# Patient Record
Sex: Female | Born: 1990 | Race: White | Hispanic: No | Smoking: Never smoker
Health system: Southern US, Community
[De-identification: ages and names within clinical notes are randomized; demographics above are authoritative.]

## PROBLEM LIST (undated history)

## (undated) DIAGNOSIS — R87629 Unspecified abnormal cytological findings in specimens from vagina: Secondary | ICD-10-CM

## (undated) DIAGNOSIS — I10 Essential (primary) hypertension: Secondary | ICD-10-CM

## (undated) DIAGNOSIS — O139 Gestational [pregnancy-induced] hypertension without significant proteinuria, unspecified trimester: Secondary | ICD-10-CM

## (undated) HISTORY — DX: Gestational (pregnancy-induced) hypertension without significant proteinuria, unspecified trimester: O13.9

## (undated) HISTORY — DX: Essential (primary) hypertension: I10

## (undated) HISTORY — DX: Unspecified abnormal cytological findings in specimens from vagina: R87.629

## (undated) HISTORY — PX: NO PAST SURGERIES: SHX2092

---
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
## 2010-09-13 LAB — DIFFERENTIAL
Basophils Absolute: 0.1 10*3/uL (ref 0.0–0.1)
Basophils Relative: 1 % (ref 0–1)
Eosinophils Absolute: 0.2 10*3/uL (ref 0.0–0.7)
Eosinophils Relative: 2 % (ref 0–5)
Lymphocytes Relative: 22 % (ref 12–46)
Lymphs Abs: 2.5 10*3/uL (ref 0.7–4.0)
Monocytes Absolute: 0.6 10*3/uL (ref 0.1–1.0)
Monocytes Relative: 6 % (ref 3–12)
Neutro Abs: 8.1 10*3/uL — ABNORMAL HIGH (ref 1.7–7.7)
Neutrophils Relative %: 70 % (ref 43–77)

## 2010-09-13 LAB — URINALYSIS, ROUTINE W REFLEX MICROSCOPIC
Bilirubin Urine: NEGATIVE
Glucose, UA: NEGATIVE mg/dL
Hgb urine dipstick: NEGATIVE
Ketones, ur: NEGATIVE mg/dL
Nitrite: NEGATIVE
Protein, ur: NEGATIVE mg/dL
Specific Gravity, Urine: 1.015 (ref 1.005–1.030)
Urobilinogen, UA: 0.2 mg/dL (ref 0.0–1.0)
pH: 7.5 (ref 5.0–8.0)

## 2010-09-13 LAB — CBC
HCT: 36.7 % (ref 36.0–46.0)
Hemoglobin: 12.6 g/dL (ref 12.0–15.0)
MCHC: 34.5 g/dL (ref 30.0–36.0)
MCV: 85.4 fL (ref 78.0–100.0)
Platelets: 286 10*3/uL (ref 150–400)
RBC: 4.29 MIL/uL (ref 3.87–5.11)
RDW: 14.2 % (ref 11.5–15.5)
WBC: 11.5 10*3/uL — ABNORMAL HIGH (ref 4.0–10.5)

## 2010-09-13 LAB — WET PREP, GENITAL
Trich, Wet Prep: NONE SEEN
Yeast Wet Prep HPF POC: NONE SEEN

## 2010-09-13 LAB — GC/CHLAMYDIA PROBE AMP, GENITAL
Chlamydia, DNA Probe: NEGATIVE
GC Probe Amp, Genital: NEGATIVE

## 2010-09-13 LAB — PREGNANCY, URINE: Preg Test, Ur: NEGATIVE

## 2010-09-17 ENCOUNTER — Emergency Department (HOSPITAL_COMMUNITY): Payer: No Typology Code available for payment source

## 2010-09-17 ENCOUNTER — Emergency Department (HOSPITAL_COMMUNITY)
Admission: EM | Admit: 2010-09-17 | Discharge: 2010-09-17 | Disposition: A | Discharge status: Home / Self Care | Payer: No Typology Code available for payment source | Attending: Emergency Medicine | Admitting: Emergency Medicine

## 2010-09-17 DIAGNOSIS — M25539 Pain in unspecified wrist: Secondary | ICD-10-CM | POA: Insufficient documentation

## 2010-09-17 DIAGNOSIS — S63509A Unspecified sprain of unspecified wrist, initial encounter: Secondary | ICD-10-CM | POA: Insufficient documentation

## 2010-09-18 LAB — DIFFERENTIAL
Basophils Absolute: 0 10*3/uL (ref 0.0–0.1)
Basophils Relative: 0 % (ref 0–1)
Eosinophils Absolute: 0 10*3/uL (ref 0.0–0.7)
Eosinophils Relative: 0 % (ref 0–5)
Lymphocytes Relative: 4 % — ABNORMAL LOW (ref 12–46)
Lymphs Abs: 0.5 10*3/uL — ABNORMAL LOW (ref 0.7–4.0)
Monocytes Absolute: 0.3 10*3/uL (ref 0.1–1.0)
Monocytes Relative: 2 % — ABNORMAL LOW (ref 3–12)
Neutro Abs: 13.8 10*3/uL — ABNORMAL HIGH (ref 1.7–7.7)
Neutrophils Relative %: 94 % — ABNORMAL HIGH (ref 43–77)

## 2010-09-18 LAB — URINALYSIS, ROUTINE W REFLEX MICROSCOPIC
Bilirubin Urine: NEGATIVE
Glucose, UA: NEGATIVE mg/dL
Hgb urine dipstick: NEGATIVE
Nitrite: NEGATIVE
Protein, ur: NEGATIVE mg/dL
Specific Gravity, Urine: 1.025 (ref 1.005–1.030)
Urobilinogen, UA: 0.2 mg/dL (ref 0.0–1.0)
pH: 5.5 (ref 5.0–8.0)

## 2010-09-18 LAB — CBC
HCT: 41.5 % (ref 36.0–46.0)
Hemoglobin: 14.1 g/dL (ref 12.0–15.0)
MCHC: 33.9 g/dL (ref 30.0–36.0)
MCV: 83.8 fL (ref 78.0–100.0)
Platelets: 266 10*3/uL (ref 150–400)
RBC: 4.96 MIL/uL (ref 3.87–5.11)
RDW: 13.4 % (ref 11.5–15.5)
WBC: 14.7 10*3/uL — ABNORMAL HIGH (ref 4.0–10.5)

## 2010-09-18 LAB — COMPREHENSIVE METABOLIC PANEL
ALT: 13 U/L (ref 0–35)
AST: 20 U/L (ref 0–37)
Albumin: 4.1 g/dL (ref 3.5–5.2)
Alkaline Phosphatase: 69 U/L (ref 39–117)
BUN: 11 mg/dL (ref 6–23)
CO2: 27 mEq/L (ref 19–32)
Calcium: 9 mg/dL (ref 8.4–10.5)
Chloride: 103 mEq/L (ref 96–112)
Creatinine, Ser: 0.75 mg/dL (ref 0.4–1.2)
GFR calc Af Amer: >60 mL/min (ref >60)
GFR calc non Af Amer: >60 mL/min (ref >60)
Glucose, Bld: 114 mg/dL — ABNORMAL HIGH (ref 70–99)
Potassium: 4 mEq/L (ref 3.5–5.1)
Sodium: 138 mEq/L (ref 135–145)
Total Bilirubin: 0.4 mg/dL (ref 0.3–1.2)
Total Protein: 7.2 g/dL (ref 6.0–8.3)

## 2010-09-18 LAB — LIPASE, BLOOD: Lipase: 20 U/L (ref 11–59)

## 2010-09-18 LAB — PREGNANCY, URINE: Preg Test, Ur: NEGATIVE

## 2011-03-02 LAB — BASIC METABOLIC PANEL
BUN: 13
CO2: 25
Calcium: 8.9
Chloride: 108
Creatinine, Ser: 0.88
Glucose, Bld: 91
Potassium: 3.7
Sodium: 137

## 2011-03-02 LAB — URINALYSIS, ROUTINE W REFLEX MICROSCOPIC
Bilirubin Urine: NEGATIVE
Glucose, UA: NEGATIVE
Hgb urine dipstick: NEGATIVE
Ketones, ur: NEGATIVE
Nitrite: NEGATIVE
Protein, ur: NEGATIVE
Specific Gravity, Urine: 1.025
Urobilinogen, UA: 1
pH: 6

## 2011-03-02 LAB — DIFFERENTIAL
Basophils Absolute: 0.1
Basophils Relative: 1
Eosinophils Absolute: 0.2
Eosinophils Relative: 2
Lymphocytes Relative: 23 — ABNORMAL LOW
Lymphs Abs: 2.5
Monocytes Absolute: 0.7
Monocytes Relative: 7
Neutro Abs: 7.3
Neutrophils Relative %: 67

## 2011-03-02 LAB — CBC
HCT: 37.7
Hemoglobin: 12.9
MCHC: 34.2
MCV: 85
Platelets: 284
RBC: 4.43
RDW: 13.7
WBC: 10.9

## 2011-03-02 LAB — PREGNANCY, URINE: Preg Test, Ur: NEGATIVE

## 2012-07-15 ENCOUNTER — Other Ambulatory Visit (HOSPITAL_COMMUNITY)
Admission: RE | Admit: 2012-07-15 | Discharge: 2012-07-15 | Disposition: A | Discharge status: Home / Self Care | Payer: Self-pay | Source: Ambulatory Visit | Attending: Unknown Physician Specialty | Admitting: Unknown Physician Specialty

## 2012-07-15 DIAGNOSIS — N870 Mild cervical dysplasia: Secondary | ICD-10-CM | POA: Insufficient documentation

## 2012-07-15 DIAGNOSIS — R87612 Low grade squamous intraepithelial lesion on cytologic smear of cervix (LGSIL): Secondary | ICD-10-CM | POA: Insufficient documentation

## 2013-06-17 ENCOUNTER — Other Ambulatory Visit: Payer: Self-pay | Admitting: Advanced Practice Midwife

## 2013-06-24 ENCOUNTER — Other Ambulatory Visit: Payer: Self-pay | Admitting: Women's Health

## 2013-06-30 ENCOUNTER — Other Ambulatory Visit: Payer: Self-pay | Admitting: Women's Health

## 2013-07-27 ENCOUNTER — Ambulatory Visit (INDEPENDENT_AMBULATORY_CARE_PROVIDER_SITE_OTHER): Payer: BC Managed Care – PPO | Admitting: Women's Health

## 2013-07-27 ENCOUNTER — Encounter: Payer: Self-pay | Admitting: Women's Health

## 2013-07-27 ENCOUNTER — Other Ambulatory Visit (HOSPITAL_COMMUNITY)
Admission: RE | Admit: 2013-07-27 | Discharge: 2013-07-27 | Disposition: A | Discharge status: Home / Self Care | Payer: BC Managed Care – PPO | Source: Ambulatory Visit | Attending: Obstetrics & Gynecology | Admitting: Obstetrics & Gynecology

## 2013-07-27 VITALS — BP 106/80 | Ht 64.0 in | Wt 149.5 lb

## 2013-07-27 DIAGNOSIS — Z113 Encounter for screening for infections with a predominantly sexual mode of transmission: Secondary | ICD-10-CM | POA: Insufficient documentation

## 2013-07-27 DIAGNOSIS — R87619 Unspecified abnormal cytological findings in specimens from cervix uteri: Secondary | ICD-10-CM | POA: Insufficient documentation

## 2013-07-27 DIAGNOSIS — R102 Pelvic and perineal pain unspecified side: Secondary | ICD-10-CM

## 2013-07-27 DIAGNOSIS — Z01419 Encounter for gynecological examination (general) (routine) without abnormal findings: Secondary | ICD-10-CM

## 2013-07-27 LAB — HIV ANTIBODY (ROUTINE TESTING W REFLEX): HIV: NONREACTIVE

## 2013-07-27 LAB — RPR

## 2013-07-27 LAB — HEPATITIS B SURFACE ANTIGEN: Hepatitis B Surface Ag: NEGATIVE

## 2013-07-27 LAB — HEPATITIS C ANTIBODY: HCV Ab: NEGATIVE

## 2013-07-27 NOTE — Progress Notes (Signed)
Patient ID: Alicia Small, female   DOB: May 17, 1991, 23 y.o.   MRN: [id number] Subjective:     Alicia Small is a 23 y.o. G0P0 Caucasian  female here for a routine well-woman exam.  Patient's last menstrual period was 07/14/2013. Regular menses lasting ~1wk, normal flow, +cramps w/ menses. Current complaints: daily pelvic pain/cramping, like menstrual-type cramps x 5859yr.  She also does not have any pleasurable sensations during sex- states she just feels numb/no feeling. Has used vibrator previously and was able to have orgasm. Denies h/o vaginal trauma or sexual abuse.  Smoking Status: non-smoker Labs: Does desire STI screening, was recently told that she was exposed to chlamydia. Does not have a PCP, does not want routine yearly labs.   The following portions of the patient's history were reviewed and updated as appropriate: allergies, current medications, past family history, past medical history, past social history, past surgical history and problem list.   Gynecologic History Patient's last menstrual period was 07/14/2013. Contraception: condoms, states 2 different kinds of COCs made her nauseated- discussed that there are other options- she does not want to try anything else at this time Last Pap: 2014. Results were: LSIL/HPV, [facility] Last mammogram: never. Results were: n/a  Obstetric History OB History  No data available  G0P0  Review of Systems Patient denies any headaches, blurred vision, shortness of breath, chest pain, abdominal pain, problems with bowel movements, urination, or intercourse.      Objective:     Physical Exam  BP 106/80  Ht 5\' 4"  (1.626 m)  Wt 149 lb 8 oz (67.813 kg)  BMI 25.65 kg/m2  LMP 07/14/2013  General:  Well developed, well nourished, no acute distress. She is alert and oriented x3. Skin:  Warm and dry Neck:  Midline trachea, no thyromegaly or nodules Cardiovascular: Regular rate and rhythm, no murmur  heard Lungs:  Effort normal, all lung fields clear to auscultation bilaterally Breasts:  No dominant palpable mass, retraction, or nipple discharge Abdomen:  Soft, non tender, no hepatosplenomegaly or masses Pelvic:  External genitalia is normal in appearance.  The vagina is normal in appearance. The cervix is bulbous, no CMT.  Thin prep pap is done w/ reflex HR HPV cotesting. Uterus is felt to be normal size, shape, and contour.  No adnexal masses noted. +tenderness Lt adnexal region.  Extremities:  No swelling or varicosities noted Psych:  She has a normal mood and affect  Assessment:     Healthy well-woman exam STD screening Pelvic pain Decreased vaginal sensation H/O abnormal pap smear     Plan:  STD screening today: HIV, RPR, Hep B&C, HSV II, gc/ch from pap  F/U 1wk for pelvic u/s and f/u visit w/ me To try lubrication, different positions, etc to see if it helps w/ decreased vaginal sensation Mammogram @ 23yo or sooner if problems Colonoscopy @ 23yo or sooner if problems   Marge DuncansBooker, Kimberly Randall CNM, [facility] 07/27/2013 10:17 AM

## 2013-07-27 NOTE — Patient Instructions (Signed)
Sexually Transmitted Disease A sexually transmitted disease (STD) is a disease or infection that may be passed (transmitted) from person to person, usually during sexual activity. This may happen by way of saliva, semen, blood, vaginal mucus, or urine. Common STDs include:   Gonorrhea.   Chlamydia.   Syphilis.   HIV and AIDS.   Genital herpes.   Hepatitis B and C.   Trichomonas.   Human papillomavirus (HPV).   Pubic lice.   Scabies.  Mites.  Bacterial vaginosis. WHAT ARE CAUSES OF STDs? An STD may be caused by bacteria, a virus, or parasites. STDs are often transmitted during sexual activity if one person is infected. However, they may also be transmitted through nonsexual means. STDs may be transmitted after:   Sexual intercourse with an infected person.   Sharing sex toys with an infected person.   Sharing needles with an infected person or using unclean piercing or tattoo needles.  Having intimate contact with the genitals, mouth, or rectal areas of an infected person.   Exposure to infected fluids during birth. WHAT ARE THE SIGNS AND SYMPTOMS OF STDs? Different STDs have different symptoms. Some people may not have any symptoms. If symptoms are present, they may include:   Painful or bloody urination.   Pain in the pelvis, abdomen, vagina, anus, throat, or eyes.   Skin rash, itching, irritation, growths, sores (lesions), ulcerations, or warts in the genital or anal area.  Abnormal vaginal discharge with or without bad odor.   Penile discharge in men.   Fever.   Pain or bleeding during sexual intercourse.   Swollen glands in the groin area.   Yellow skin and eyes (jaundice). This is seen with hepatitis.   Swollen testicles.  Infertility.  Sores and blisters in the mouth. HOW ARE STDs DIAGNOSED? To make a diagnosis, your health care provider may:   Take a medical history.   Perform a physical exam.   Take a sample of any  discharge for examination.  Swab the throat, cervix, opening to the penis, rectum, or vagina for testing.  Test a sample of your first morning urine.   Perform blood tests.   Perform a Pap smear, if this applies.   Perform a colposcopy.   Perform a laparoscopy.  HOW ARE STDs TREATED? Treatment depends on the STD. Some STDs may be treated but not cured.   Chlamydia, gonorrhea, trichomonas, and syphilis can be cured with antibiotics.   Genital herpes, hepatitis, and HIV can be treated, but not cured, with prescribed medicines. The medicines lessen symptoms.   Genital warts from HPV can be treated with medicine or by freezing, burning (electrocautery), or surgery. Warts may come back.   HPV cannot be cured with medicine or surgery. However, abnormal areas may be removed from the cervix, vagina, or vulva.   If your diagnosis is confirmed, your recent sexual partners need treatment. This is true even if they are symptom-free or have a negative culture or evaluation. They should not have sex until their health care providers say it is OK. HOW CAN I REDUCE MY RISK OF GETTING AN STD?  Use latex condoms, dental dams, and water-soluble lubricants during sexual activity. Do not use petroleum jelly or oils.  Get vaccinated for HPV and hepatitis. If you have not received these vaccines in the past, talk to your health care provider about whether one or both might be right for you.   Avoid risky sex practices that can break the skin.  WHAT SHOULD   I DO IF I THINK I HAVE AN STD?  See your health care provider.   Inform all sexual partners. They should be tested and treated for any STDs.  Do not have sex until your health care provider says it is OK. WHEN SHOULD I GET HELP? Seek immediate medical care if:  You develop severe abdominal pain.  You are a man and notice swelling or pain in the testicles.  You are a woman and notice swelling or pain in your vagina. Document  Released: 08/11/2002 Document Revised: 03/11/2013 Document Reviewed: 12/09/2012 ExitCare Patient Information 2014 [employer].  

## 2013-07-28 LAB — HSV 2 ANTIBODY, IGG: HSV 2 Glycoprotein G Ab, IgG: <0.1 IV

## 2013-08-04 ENCOUNTER — Encounter: Payer: Self-pay | Admitting: Adult Health

## 2013-08-04 ENCOUNTER — Other Ambulatory Visit: Payer: Self-pay | Admitting: Women's Health

## 2013-08-04 ENCOUNTER — Ambulatory Visit (INDEPENDENT_AMBULATORY_CARE_PROVIDER_SITE_OTHER): Payer: BC Managed Care – PPO

## 2013-08-04 ENCOUNTER — Ambulatory Visit (INDEPENDENT_AMBULATORY_CARE_PROVIDER_SITE_OTHER): Payer: BC Managed Care – PPO | Admitting: Women's Health

## 2013-08-04 ENCOUNTER — Encounter: Payer: Self-pay | Admitting: Women's Health

## 2013-08-04 VITALS — BP 108/60 | Ht 64.0 in | Wt 151.0 lb

## 2013-08-04 DIAGNOSIS — R102 Pelvic and perineal pain: Secondary | ICD-10-CM

## 2013-08-04 DIAGNOSIS — N949 Unspecified condition associated with female genital organs and menstrual cycle: Secondary | ICD-10-CM

## 2013-08-04 DIAGNOSIS — R9389 Abnormal findings on diagnostic imaging of other specified body structures: Secondary | ICD-10-CM

## 2013-08-04 NOTE — Progress Notes (Signed)
Patient ID: Alicia KernJessica Small, female   DOB: 29-Jun-1990, 23 y.o.   MRN: [id number]   [facility]  Patient name: Alicia KernJessica Small MRN [id number]  Date of birth: 29-Jun-1990  CC & HPI:  Alicia KernJessica Small is a 23 y.o. Caucasian female presenting today for f/u after u/s for pelvic pain. No complaints today. Reviewed pap and STD screening results, all neg.   Pertinent History Reviewed:  Medical & Surgical Hx:   Past Medical History  Diagnosis Date  . Abnormal vaginal Pap smear    History reviewed. No pertinent past surgical history. Medications: Reviewed & Updated - see associated section Social History: Reviewed -  reports that she has never smoked. She has never used smokeless tobacco.  Objective Findings:  Vitals: BP 108/60  Ht 5\' 4"  (1.626 m)  Wt 151 lb (68.493 kg)  BMI 25.91 kg/m2  LMP 07/14/2013  Physical Examination: General appearance - alert, well appearing, and in no distress  Pelvic u/s today:  Uterus 7.4 x 4.9 x 3.7 cm, anteverted no myometrial masses noted  Endometrium 17 mm, symmetrical,  Right ovary 3.1 x 2.3 x 1.9 cm, with follicles noted  Left ovary 2.8 x 1.8 x 1.7 cm, with follicles noted  + Free fluid noted within posterior cul-de-sac  Technician Comments:  Anteverted uterus, ENDOM=7517mm, bilateral adnexa/ovaries appears wnl, +Free fluid noted within posterior cul-de-sac   Assessment & Plan:  A:   Pelvic pain P:  Discussed u/s results w/ JAG, will repeat pelvic u/s after menses   Pt to call when menses ends to schedule f/u pelvic u/s and visit  Marge DuncansBooker, Juelz Claar Randall CNM, [facility]NP[facility]BC 08/04/2013 12:18 PM

## 2013-08-24 ENCOUNTER — Encounter: Payer: Self-pay | Admitting: Women's Health

## 2013-08-24 ENCOUNTER — Ambulatory Visit (INDEPENDENT_AMBULATORY_CARE_PROVIDER_SITE_OTHER): Payer: BC Managed Care – PPO | Admitting: Women's Health

## 2013-08-24 VITALS — Ht 64.0 in

## 2013-08-24 DIAGNOSIS — N949 Unspecified condition associated with female genital organs and menstrual cycle: Secondary | ICD-10-CM

## 2013-08-24 DIAGNOSIS — R102 Pelvic and perineal pain: Secondary | ICD-10-CM

## 2013-08-24 NOTE — Patient Instructions (Signed)
Pelvic Pain, Female °Female pelvic pain can be caused by many different things and start from a variety of places. Pelvic pain refers to pain that is located in the lower half of the abdomen and between your hips. The pain may occur over a short period of time (acute) or may be reoccurring (chronic). The cause of pelvic pain may be related to disorders affecting the female reproductive organs (gynecologic), but it may also be related to the bladder, kidney stones, an intestinal complication, or muscle or skeletal problems. Getting help right away for pelvic pain is important, especially if there has been severe, sharp, or a sudden onset of unusual pain. It is also important to get help right away because some types of pelvic pain can be life threatening.  °CAUSES  °Below are only some of the causes of pelvic pain. The causes of pelvic pain can be in one of several categories.  °· Gynecologic. °· Pelvic inflammatory disease. °· Sexually transmitted infection. °· Ovarian cyst or a twisted ovarian ligament (ovarian torsion). °· Uterine lining that grows outside the uterus (endometriosis). °· Fibroids, cysts, or tumors. °· Ovulation. °· Pregnancy. °· Pregnancy that occurs outside the uterus (ectopic pregnancy). °· Miscarriage. °· Labor. °· Abruption of the placenta or ruptured uterus. °· Infection. °· Uterine infection (endometritis). °· Bladder infection. °· Diverticulitis. °· Miscarriage related to a uterine infection (septic abortion). °· Bladder. °· Inflammation of the bladder (cystitis). °· Kidney stone(s). °· Gastrointenstinal. °· Constipation. °· Diverticulitis. °· Neurologic. °· Trauma. °· Feeling pelvic pain because of mental or emotional causes (psychosomatic). °· Cancers of the bowel or pelvis. °EVALUATION  °Your caregiver will want to take a careful history of your concerns. This includes recent changes in your health, a careful gynecologic history of your periods (menses), and a sexual history. Obtaining  your family history and medical history is also important. Your caregiver may suggest a pelvic exam. A pelvic exam will help identify the location and severity of the pain. It also helps in the evaluation of which organ system may be involved. In order to identify the cause of the pelvic pain and be properly treated, your caregiver may order tests. These tests may include:  °· A pregnancy test. °· Pelvic ultrasonography. °· An X-ray exam of the abdomen. °· A urinalysis or evaluation of vaginal discharge. °· Blood tests. °HOME CARE INSTRUCTIONS  °· Only take over-the-counter or prescription medicines for pain, discomfort, or fever as directed by your caregiver.   °· Rest as directed by your caregiver.   °· Eat a balanced diet.   °· Drink enough fluids to make your urine clear or pale yellow, or as directed.   °· Avoid sexual intercourse if it causes pain.   °· Apply warm or cold compresses to the lower abdomen depending on which one helps the pain.   °· Avoid stressful situations.   °· Keep a journal of your pelvic pain. Write down when it started, where the pain is located, and if there are things that seem to be associated with the pain, such as food or your menstrual cycle. °· Follow up with your caregiver as directed.   °SEEK MEDICAL CARE IF: °· Your medicine does not help your pain. °· You have abnormal vaginal discharge. °SEEK IMMEDIATE MEDICAL CARE IF:  °· You have heavy bleeding from the vagina.   °· Your pelvic pain increases.   °· You feel lightheaded or faint.   °· You have chills.   °· You have pain with urination or blood in your urine.   °· You have uncontrolled   diarrhea or vomiting.   °· You have a fever or persistent symptoms for more than 3 days. °· You have a fever and your symptoms suddenly get worse.   °· You are being physically or sexually abused.   °MAKE SURE YOU: °· Understand these instructions. °· Will watch your condition. °· Will get help if you are not doing well or get worse. °Document  Released: 04/17/2004 Document Revised: 11/20/2011 Document Reviewed: 09/10/2011 °ExitCare® Patient Information ©2014 [employer]. ° °

## 2013-08-24 NOTE — Progress Notes (Signed)
Patient ID: Alicia KernJessica Small, female   DOB: [birth year], 23 y.o.   MRN: [id number]   [facility]  Patient name: Alicia KernJessica Small MRN [id number]  Date of birth: [birth year]  CC & HPI:  Alicia KernJessica Small is a 23 y.o. Caucasian female scheduled today for visit, but was supposed to be for repeat pelvic u/s after menses.  Patient's last menstrual period was 08/14/2013. Still having daily cramping, not very bad. Condoms for contraception, her STI screen last month was neg.    Pertinent History Reviewed:  Medical & Surgical Hx:   Past Medical History  Diagnosis Date  . Abnormal vaginal Pap smear    History reviewed. No pertinent past surgical history. Medications: Reviewed & Updated - see associated section Social History: Reviewed -  reports that she has never smoked. She has never used smokeless tobacco.  Objective Findings:  Vitals: Ht 5\' 4"  (1.626 m)  LMP 08/14/2013  Physical Examination: General appearance - alert, well appearing, and in no distress  No results found for this or any previous visit (from the past 24 hour(s)).   Assessment & Plan:  A:   Pelvic pain P:   F/U ~3.5wks for repeat pelvic u/s and visit   Marge DuncansBooker, Tansy Lorek Randall CNM, [facility]NP[facility] 08/24/2013 3:13 PM

## 2013-09-16 ENCOUNTER — Other Ambulatory Visit: Payer: Self-pay | Admitting: Women's Health

## 2013-09-16 ENCOUNTER — Encounter: Payer: Self-pay | Admitting: Women's Health

## 2013-09-16 ENCOUNTER — Ambulatory Visit (INDEPENDENT_AMBULATORY_CARE_PROVIDER_SITE_OTHER): Payer: BC Managed Care – PPO

## 2013-09-16 ENCOUNTER — Ambulatory Visit (INDEPENDENT_AMBULATORY_CARE_PROVIDER_SITE_OTHER): Payer: BC Managed Care – PPO | Admitting: Women's Health

## 2013-09-16 VITALS — BP 118/80 | Ht 64.0 in | Wt 144.5 lb

## 2013-09-16 DIAGNOSIS — R102 Pelvic and perineal pain: Secondary | ICD-10-CM

## 2013-09-16 DIAGNOSIS — R350 Frequency of micturition: Secondary | ICD-10-CM

## 2013-09-16 DIAGNOSIS — R9389 Abnormal findings on diagnostic imaging of other specified body structures: Secondary | ICD-10-CM

## 2013-09-16 DIAGNOSIS — N949 Unspecified condition associated with female genital organs and menstrual cycle: Secondary | ICD-10-CM

## 2013-09-16 DIAGNOSIS — R109 Unspecified abdominal pain: Secondary | ICD-10-CM

## 2013-09-16 LAB — CBC
HCT: 39.9 % (ref 36.0–46.0)
Hemoglobin: 14 g/dL (ref 12.0–15.0)
MCH: 30.2 pg (ref 26.0–34.0)
MCHC: 35.1 g/dL (ref 30.0–36.0)
MCV: 86 fL (ref 78.0–100.0)
PLATELETS: 334 10*3/uL (ref 150–400)
RBC: 4.64 MIL/uL (ref 3.87–5.11)
RDW: 13.2 % (ref 11.5–15.5)
WBC: 6.5 10*3/uL (ref 4.0–10.5)

## 2013-09-16 LAB — SEDIMENTATION RATE: SED RATE: 4 mm/h (ref 0–22)

## 2013-09-16 NOTE — Progress Notes (Signed)
Patient ID: Alicia Small, female   DOB: [birth year], 23 y.o.   MRN: [id number]   [facility]  Patient name: Alicia Small MRN [id number]  Date of birth: [birth year]  CC & HPI:  Alicia Small is a 23 y.o. Caucasian female presenting today for f/u after pelvic u/s for lower pelvic cramping x 6676yr. She had an u/s 08/04/13 which revealed a thickened endometrium and +free fluid in posterior cul-de-sac, however she was in the premenstrual phase of her cycle, so she was brought back today after her period ended and u/s was repeated and revealed normal postmenstrual endometrial thickness and only a minimal amount of free fluid in the posterior cul-de-sac, uterus anteverted w/o myometrial masses, bilateral adnexa/ovaries wnl. Pt still reports daily lower pelvic cramping, like menstrual-type cramping. She had neg STD screening and pap 07/27/13. Reports normal bowel habits, soft-formed stool 1-2x/day, no diarrhea. Urinates frequently: ~5x q am, sporadically throughout day, then wakes up 2-3x/night to void, occ urgency. Also gets urge to void occ w/ sexual intercourse.   Pertinent History Reviewed:  Medical & Surgical Hx:   Past Medical History  Diagnosis Date  . Abnormal vaginal Pap smear    History reviewed. No pertinent past surgical history. Medications: Reviewed & Updated - see associated section Social History: Reviewed -  reports that she has never smoked. She has never used smokeless tobacco.  Objective Findings:  Vitals: BP 118/80  Ht 5\' 4"  (1.626 m)  Wt 144 lb 8 oz (65.545 kg)  BMI 24.79 kg/m2  LMP 09/10/2013  Physical Examination: General appearance - alert, well appearing, and in no distress  Today's u/s: Uterus 6.9 x 4.9 x 3.5 cm, no myometrial masses noted  Endometrium 5.4 mm, symmetrical,  Right ovary 2.3 x 1.6 x 1.4 cm,  Left ovary 2.7 x 1.7 x 1.3 cm,  Minimal amount of free fluid noted within posterior cul-de-sac noted  Technician Comments:  Anteverted uterus  noted, Endom-5.674mm, bilateral adnexa/ovaries appears WNL, Minimal amount of free fluid noted within posterior cul-de-sac noted  Lossie Faesasha M McBride  09/16/2013  10:43 AM   Assessment & Plan:  A:   Daily lower pelvic cramping x 9176yr, possible IC P:  CBC, Sed rate, UA, urine cx today   F/U 1wk for visit w/ [facility] to further discuss sx/possibility of IC   American FinancialKimberly Randall Roselina Burgueno CNM, [facility] 09/16/2013 11:19 AM

## 2013-09-17 LAB — URINALYSIS
Bilirubin Urine: NEGATIVE
Glucose, UA: NEGATIVE mg/dL
HGB URINE DIPSTICK: NEGATIVE
Ketones, ur: NEGATIVE mg/dL
LEUKOCYTES UA: NEGATIVE
NITRITE: NEGATIVE
PROTEIN: NEGATIVE mg/dL
Specific Gravity, Urine: 1.018 (ref 1.005–1.030)
UROBILINOGEN UA: 0.2 mg/dL (ref 0.0–1.0)
pH: 6 (ref 5.0–8.0)

## 2013-09-18 LAB — URINE CULTURE: Colony Count: 5000

## 2013-09-23 ENCOUNTER — Encounter: Payer: BC Managed Care – PPO | Admitting: Obstetrics & Gynecology

## 2013-09-24 ENCOUNTER — Encounter: Payer: Self-pay | Admitting: *Deleted

## 2013-09-24 ENCOUNTER — Encounter: Payer: BC Managed Care – PPO | Admitting: Obstetrics & Gynecology

## 2014-04-22 ENCOUNTER — Ambulatory Visit: Payer: BC Managed Care – PPO | Admitting: Obstetrics & Gynecology

## 2014-04-27 ENCOUNTER — Ambulatory Visit: Payer: BC Managed Care – PPO | Admitting: Obstetrics & Gynecology

## 2014-05-11 ENCOUNTER — Ambulatory Visit: Payer: BC Managed Care – PPO | Admitting: Obstetrics & Gynecology

## 2014-05-11 ENCOUNTER — Encounter: Payer: Self-pay | Admitting: *Deleted

## 2014-10-28 ENCOUNTER — Emergency Department (HOSPITAL_COMMUNITY)
Admission: EM | Admit: 2014-10-28 | Discharge: 2014-10-28 | Disposition: A | Discharge status: Home / Self Care | Payer: BLUE CROSS/BLUE SHIELD | Attending: Emergency Medicine | Admitting: Emergency Medicine

## 2014-10-28 ENCOUNTER — Encounter (HOSPITAL_COMMUNITY): Payer: Self-pay

## 2014-10-28 DIAGNOSIS — N938 Other specified abnormal uterine and vaginal bleeding: Secondary | ICD-10-CM | POA: Diagnosis not present

## 2014-10-28 DIAGNOSIS — M549 Dorsalgia, unspecified: Secondary | ICD-10-CM | POA: Diagnosis not present

## 2014-10-28 DIAGNOSIS — Z880 Allergy status to penicillin: Secondary | ICD-10-CM | POA: Insufficient documentation

## 2014-10-28 DIAGNOSIS — R109 Unspecified abdominal pain: Secondary | ICD-10-CM | POA: Insufficient documentation

## 2014-10-28 DIAGNOSIS — R5383 Other fatigue: Secondary | ICD-10-CM | POA: Diagnosis not present

## 2014-10-28 DIAGNOSIS — Z3202 Encounter for pregnancy test, result negative: Secondary | ICD-10-CM | POA: Diagnosis not present

## 2014-10-28 DIAGNOSIS — R110 Nausea: Secondary | ICD-10-CM | POA: Diagnosis not present

## 2014-10-28 DIAGNOSIS — N939 Abnormal uterine and vaginal bleeding, unspecified: Secondary | ICD-10-CM

## 2014-10-28 LAB — URINALYSIS, ROUTINE W REFLEX MICROSCOPIC
Glucose, UA: NEGATIVE mg/dL
Leukocytes, UA: NEGATIVE
NITRITE: NEGATIVE
PH: 6 (ref 5.0–8.0)
PROTEIN: NEGATIVE mg/dL
Urobilinogen, UA: 1 mg/dL (ref 0.0–1.0)

## 2014-10-28 LAB — URINE MICROSCOPIC-ADD ON

## 2014-10-28 LAB — PREGNANCY, URINE: Preg Test, Ur: NEGATIVE

## 2014-10-28 MED ORDER — ONDANSETRON 4 MG PO TBDP
4.0000 mg | ORAL_TABLET | Freq: Once | ORAL | Status: AC
  Administered 2014-10-28: 4 mg via ORAL

## 2014-10-28 MED ORDER — ONDANSETRON HCL 8 MG PO TABS: 8.0000 mg | ORAL_TABLET | ORAL | Status: DC | PRN

## 2014-10-28 MED ORDER — ONDANSETRON 4 MG PO TBDP
ORAL_TABLET | ORAL | Status: AC
  Filled 2014-10-28: qty 1

## 2014-10-28 NOTE — ED Provider Notes (Signed)
CSN: [id number]     Arrival date & time 10/28/14  1726 History   First MD Initiated Contact with Patient 10/28/14 2013     Chief Complaint  Patient presents with  . Nausea  . Vaginal Bleeding    Patient is a 24 y.o. female presenting with vaginal bleeding. The history is provided by the patient.  Vaginal Bleeding Quality: brown and red. Severity:  Mild Onset quality:  Gradual Duration:  1 day Timing:  Intermittent Chronicity:  New Relieved by:  None tried Worsened by:  Nothing tried Associated symptoms: abdominal pain, back pain, fatigue and nausea   Associated symptoms: no dysuria and no fever    Pt reports she has had nausea for several days She reports she vomited several days ago (none today) She reports she has had some vag bleeding today She reports this is irregular bleeding for her No fever No dysuria She reports mild lower abdominal pain for several days Past Medical History  Diagnosis Date  . Abnormal vaginal Pap smear    History reviewed. No pertinent past surgical history. Family History  Problem Relation Age of Onset  . Hypertension Mother   . Diabetes Maternal Grandfather   . Cancer Maternal Grandfather     lung  . Stroke Maternal Grandfather   . Other Paternal Grandfather     polyps   History  Substance Use Topics  . Smoking status: Never Smoker   . Smokeless tobacco: Never Used  . Alcohol Use: Yes     Comment: rarely   OB History    Gravida Para Term Preterm AB TAB SAB Ectopic Multiple Living   0 0             Review of Systems  Constitutional: Positive for fatigue. Negative for fever.  Gastrointestinal: Positive for nausea and abdominal pain.  Genitourinary: Positive for vaginal bleeding. Negative for dysuria.  Musculoskeletal: Positive for back pain.  All other systems reviewed and are negative.     Allergies  Penicillins  Home Medications   Prior to Admission medications   Medication Sig Start Date End Date Taking? Authorizing  Provider  ondansetron (ZOFRAN) 8 MG tablet Take 1 tablet (8 mg total) by mouth every 4 (four) hours as needed for nausea. 10/28/14   Zadie Rhineonald Kessa Fairbairn, MD   BP 122/86 mmHg  Pulse 100  Temp(Src) 97.9 F (36.6 C) (Oral)  Resp 20  Ht 5\' 4"  (1.626 m)  Wt 140 lb (63.504 kg)  BMI 24.02 kg/m2  SpO2 100%  LMP 10/11/2014 Physical Exam CONSTITUTIONAL: Well developed/well nourished HEAD: Normocephalic/atraumatic EYES: EOMI/PERRL ENMT: Mucous membranes moist NECK: supple no meningeal signs SPINE/BACK:entire spine nontender CV: S1/S2 noted, no murmurs/rubs/gallops noted LUNGS: Lungs are clear to auscultation bilaterally, no apparent distress ABDOMEN: soft, nontender, no rebound or guarding, bowel sounds noted throughout abdomen GU:no cva tenderness NEURO: Pt is awake/alert/appropriate, moves all extremitiesx4.  No facial droop.   EXTREMITIES: pulses normal/equal, full ROM SKIN: warm, color normal PSYCH: no abnormalities of mood noted, alert and oriented to situation  ED Course  Procedures  Pt well appearing, no distress, taking PO No focal abd tenderness I offered pelvic exam for further evaluation but she declined She reports she has GYN followup already arranged for next week  Labs Review Labs Reviewed  URINALYSIS, ROUTINE W REFLEX MICROSCOPIC (NOT AT [facility]) - Abnormal; Notable for the following:    Specific Gravity, Urine >1.030 (*)    Hgb urine dipstick MODERATE (*)    Bilirubin Urine SMALL (*)  Ketones, ur TRACE (*)    All other components within normal limits  URINE MICROSCOPIC-ADD ON - Abnormal; Notable for the following:    Squamous Epithelial / LPF MANY (*)    Bacteria, UA FEW (*)    All other components within normal limits  PREGNANCY, URINE   Medications  ondansetron (ZOFRAN-ODT) disintegrating tablet 4 mg (4 mg Oral Given 10/28/14 2041)     MDM   Final diagnoses:  Nausea  Vaginal bleeding    Nursing notes including past medical history and social history  reviewed and considered in documentation Labs/vital reviewed myself and considered during evaluation     Zadie Rhine, MD 10/28/14 2109

## 2014-10-28 NOTE — ED Notes (Signed)
Pt reports lmp was May 9th and it was normal.  Reports has had nausea x 1 week and noticed a brown vaginal discharge since last night and has gotten heavier today.  Also c/o feeling tired.

## 2014-11-03 ENCOUNTER — Ambulatory Visit: Payer: Self-pay | Admitting: Adult Health

## 2015-11-07 ENCOUNTER — Ambulatory Visit: Payer: Self-pay | Admitting: Adult Health

## 2015-11-25 ENCOUNTER — Other Ambulatory Visit: Payer: Self-pay | Admitting: Obstetrics & Gynecology

## 2015-11-25 DIAGNOSIS — O3680X0 Pregnancy with inconclusive fetal viability, not applicable or unspecified: Secondary | ICD-10-CM

## 2015-11-28 ENCOUNTER — Ambulatory Visit (INDEPENDENT_AMBULATORY_CARE_PROVIDER_SITE_OTHER): Payer: Medicaid Other

## 2015-11-28 DIAGNOSIS — Z3A09 9 weeks gestation of pregnancy: Secondary | ICD-10-CM

## 2015-11-28 DIAGNOSIS — O3680X0 Pregnancy with inconclusive fetal viability, not applicable or unspecified: Secondary | ICD-10-CM | POA: Diagnosis not present

## 2015-11-28 NOTE — Progress Notes (Signed)
US 8+6 wks,single IUP w/ys,pos fht 175 bpm,normal ov's bilat,crl 19.3 mm

## 2015-12-12 ENCOUNTER — Encounter: Payer: Medicaid Other | Admitting: Women's Health

## 2015-12-19 ENCOUNTER — Other Ambulatory Visit (HOSPITAL_COMMUNITY)
Admission: RE | Admit: 2015-12-19 | Discharge: 2015-12-19 | Disposition: A | Discharge status: Home / Self Care | Payer: Medicaid Other | Source: Ambulatory Visit | Attending: Obstetrics & Gynecology | Admitting: Obstetrics & Gynecology

## 2015-12-19 ENCOUNTER — Encounter: Payer: Self-pay | Admitting: Women's Health

## 2015-12-19 ENCOUNTER — Ambulatory Visit (INDEPENDENT_AMBULATORY_CARE_PROVIDER_SITE_OTHER): Payer: Medicaid Other | Admitting: Women's Health

## 2015-12-19 VITALS — BP 120/72 | HR 94 | Wt 141.0 lb

## 2015-12-19 DIAGNOSIS — Z3401 Encounter for supervision of normal first pregnancy, first trimester: Secondary | ICD-10-CM | POA: Diagnosis not present

## 2015-12-19 DIAGNOSIS — Z3682 Encounter for antenatal screening for nuchal translucency: Secondary | ICD-10-CM

## 2015-12-19 DIAGNOSIS — Z01419 Encounter for gynecological examination (general) (routine) without abnormal findings: Secondary | ICD-10-CM

## 2015-12-19 DIAGNOSIS — Z3A12 12 weeks gestation of pregnancy: Secondary | ICD-10-CM

## 2015-12-19 DIAGNOSIS — Z369 Encounter for antenatal screening, unspecified: Secondary | ICD-10-CM

## 2015-12-19 DIAGNOSIS — Z331 Pregnant state, incidental: Secondary | ICD-10-CM

## 2015-12-19 DIAGNOSIS — Z1389 Encounter for screening for other disorder: Secondary | ICD-10-CM

## 2015-12-19 DIAGNOSIS — Z3400 Encounter for supervision of normal first pregnancy, unspecified trimester: Secondary | ICD-10-CM | POA: Insufficient documentation

## 2015-12-19 DIAGNOSIS — O99321 Drug use complicating pregnancy, first trimester: Secondary | ICD-10-CM

## 2015-12-19 DIAGNOSIS — Z124 Encounter for screening for malignant neoplasm of cervix: Secondary | ICD-10-CM

## 2015-12-19 DIAGNOSIS — Z0283 Encounter for blood-alcohol and blood-drug test: Secondary | ICD-10-CM

## 2015-12-19 LAB — POCT URINALYSIS DIPSTICK
Blood, UA: NEGATIVE
Ketones, UA: NEGATIVE
Leukocytes, UA: NEGATIVE
NITRITE UA: NEGATIVE
Protein, UA: NEGATIVE

## 2015-12-19 MED ORDER — DOXYLAMINE-PYRIDOXINE 10-10 MG PO TBEC: DELAYED_RELEASE_TABLET | ORAL | Status: DC

## 2015-12-19 NOTE — Progress Notes (Signed)
  Subjective:  Alicia Small is a 25 y.o. G1P0 Caucasian female at 10452w6d by LMP c/w 8wk u/s, being seen today for her first obstetrical visit.  Her obstetrical history is significant for primigravida, recent THC use- reports none since June.  Pregnancy history fully reviewed.  Patient reports n/v- requests meds. Denies vb, cramping, uti s/s, abnormal/malodorous vag d/c, or vulvovaginal itching/irritation.  BP 120/72 mmHg  Pulse 94  Wt 141 lb (63.957 kg)  LMP 09/27/2015 (Exact Date)  HISTORY: OB History  Gravida Para Term Preterm AB SAB TAB Ectopic Multiple Living  1 0            # Outcome Date GA Lbr Len/2nd Weight Sex Delivery Anes PTL Lv  1 Current              Past Medical History  Diagnosis Date  . Abnormal vaginal Pap smear    History reviewed. No pertinent past surgical history. Family History  Problem Relation Age of Onset  . Hypertension Mother   . Diabetes Maternal Grandfather   . Cancer Maternal Grandfather     lung  . Stroke Maternal Grandfather   . Other Paternal Grandfather     polyps  . Hypertension Father   . Autism Brother   . Asthma Maternal Grandmother   . Thyroid disease Maternal Grandmother     Exam   System:     General: Well developed & nourished, no acute distress   Skin: Warm & dry, normal coloration and turgor, no rashes   Neurologic: Alert & oriented, normal mood   Cardiovascular: Regular rate & rhythm   Respiratory: Effort & rate normal, LCTAB, acyanotic   Abdomen: Soft, non tender   Extremities: normal strength, tone   Pelvic Exam:    Perineum: Normal perineum   Vulva: Normal, no lesions   Vagina:  Normal mucosa, normal discharge   Cervix: Normal, bulbous, appears closed   Uterus: Normal size/shape/contour for GA   Thin prep pap smear obtained w/ reflex high risk HPV cotesting FHR: 160 via doppler   Assessment:   Pregnancy: G1P0 Patient Active Problem List   Diagnosis Date Noted  . Supervision of normal first pregnancy  12/19/2015    Priority: High  . Abnormal Pap smear of cervix 07/27/2013    Priority: High  . Pelvic pain in female 07/27/2013    Priority: High    6952w6d G1P0 New OB visit   Plan:  Initial labs drawn Continue prenatal vitamins Problem list reviewed and updated Reviewed n/v relief measures and warning s/s to report Rx diclegis, prior auth approved through [employer] today, and 0 samples given.  Reviewed recommended weight gain based on pre-gravid BMI Encouraged well-balanced diet Genetic Screening discussed Integrated Screen: requested Cystic fibrosis screening discussed requested Ultrasound discussed; fetal survey: requested Follow up within the next week for 1st it/nt (no visit), then 4 weeks for 2nd IT and visit [facility] completed NFPartnership offered, accepted, referral faxed   Marge DuncansBooker, Hessie Varone Randall CNM, [facility]NP[facility]BC 12/19/2015 2:13 PM

## 2015-12-19 NOTE — Patient Instructions (Signed)

## 2015-12-20 ENCOUNTER — Other Ambulatory Visit: Payer: Medicaid Other

## 2015-12-20 ENCOUNTER — Ambulatory Visit (INDEPENDENT_AMBULATORY_CARE_PROVIDER_SITE_OTHER): Payer: Medicaid Other

## 2015-12-20 ENCOUNTER — Encounter: Payer: Self-pay | Admitting: Women's Health

## 2015-12-20 DIAGNOSIS — Z3A12 12 weeks gestation of pregnancy: Secondary | ICD-10-CM

## 2015-12-20 DIAGNOSIS — Z3682 Encounter for antenatal screening for nuchal translucency: Secondary | ICD-10-CM

## 2015-12-20 DIAGNOSIS — Z36 Encounter for antenatal screening of mother: Secondary | ICD-10-CM | POA: Diagnosis not present

## 2015-12-20 DIAGNOSIS — Z3401 Encounter for supervision of normal first pregnancy, first trimester: Secondary | ICD-10-CM

## 2015-12-20 DIAGNOSIS — O9989 Other specified diseases and conditions complicating pregnancy, childbirth and the puerperium: Secondary | ICD-10-CM

## 2015-12-20 DIAGNOSIS — O09899 Supervision of other high risk pregnancies, unspecified trimester: Secondary | ICD-10-CM | POA: Insufficient documentation

## 2015-12-20 DIAGNOSIS — Z283 Underimmunization status: Secondary | ICD-10-CM | POA: Insufficient documentation

## 2015-12-20 DIAGNOSIS — Z369 Encounter for antenatal screening, unspecified: Secondary | ICD-10-CM

## 2015-12-20 LAB — PMP SCREEN PROFILE (10S), URINE
AMPHETAMINE SCRN UR: NEGATIVE ng/mL
Barbiturate Screen, Ur: NEGATIVE ng/mL
Benzodiazepine Screen, Urine: NEGATIVE ng/mL
COCAINE(METAB.) SCREEN, URINE: NEGATIVE ng/mL
Cannabinoids Ur Ql Scn: POSITIVE ng/mL
Creatinine(Crt), U: 90.9 mg/dL (ref 20.0–300.0)
METHADONE SCREEN, URINE: NEGATIVE ng/mL
OPIATE SCRN UR: NEGATIVE ng/mL
OXYCODONE+OXYMORPHONE UR QL SCN: NEGATIVE ng/mL
PCP Scrn, Ur: NEGATIVE ng/mL
PROPOXYPHENE SCREEN: NEGATIVE ng/mL
Ph of Urine: 6.2 (ref 4.5–8.9)

## 2015-12-20 LAB — CBC
HEMATOCRIT: 41.1 % (ref 34.0–46.6)
Hemoglobin: 14.2 g/dL (ref 11.1–15.9)
MCH: 30.3 pg (ref 26.6–33.0)
MCHC: 34.5 g/dL (ref 31.5–35.7)
MCV: 88 fL (ref 79–97)
Platelets: 337 10*3/uL (ref 150–379)
RBC: 4.69 x10E6/uL (ref 3.77–5.28)
RDW: 13.2 % (ref 12.3–15.4)
WBC: 12.3 10*3/uL — ABNORMAL HIGH (ref 3.4–10.8)

## 2015-12-20 LAB — URINALYSIS, ROUTINE W REFLEX MICROSCOPIC
BILIRUBIN UA: NEGATIVE
Glucose, UA: NEGATIVE
Ketones, UA: NEGATIVE
Leukocytes, UA: NEGATIVE
NITRITE UA: NEGATIVE
PH UA: 6.5 (ref 5.0–7.5)
Protein, UA: NEGATIVE
RBC, UA: NEGATIVE
Specific Gravity, UA: 1.016 (ref 1.005–1.030)
UUROB: 0.2 mg/dL (ref 0.2–1.0)

## 2015-12-20 LAB — ABO/RH: RH TYPE: POSITIVE

## 2015-12-20 LAB — VARICELLA ZOSTER ANTIBODY, IGG: VARICELLA: 1008 {index} (ref >165)

## 2015-12-20 LAB — HEPATITIS B SURFACE ANTIGEN: Hepatitis B Surface Ag: NEGATIVE

## 2015-12-20 LAB — RUBELLA SCREEN: Rubella Antibodies, IGG: <0.9 index — ABNORMAL LOW (ref >0.99)

## 2015-12-20 LAB — RPR: RPR Ser Ql: NONREACTIVE

## 2015-12-20 LAB — ANTIBODY SCREEN: Antibody Screen: NEGATIVE

## 2015-12-20 LAB — HIV ANTIBODY (ROUTINE TESTING W REFLEX): HIV SCREEN 4TH GENERATION: NONREACTIVE

## 2015-12-20 NOTE — Progress Notes (Signed)
US 12 wks,measurements c/w dates,normal ov's bilat,NB present,NT 1.1 mm,crl 51.6 mm

## 2015-12-21 ENCOUNTER — Encounter: Payer: Self-pay | Admitting: Women's Health

## 2015-12-21 DIAGNOSIS — F1290 Cannabis use, unspecified, uncomplicated: Secondary | ICD-10-CM | POA: Insufficient documentation

## 2015-12-21 LAB — GC/CHLAMYDIA PROBE AMP
CHLAMYDIA, DNA PROBE: NEGATIVE
Neisseria gonorrhoeae by PCR: NEGATIVE

## 2015-12-21 LAB — CYTOLOGY - PAP

## 2015-12-21 LAB — URINE CULTURE: Organism ID, Bacteria: NO GROWTH

## 2015-12-22 LAB — MATERNAL SCREEN, INTEGRATED #1
CROWN RUMP LENGTH MAT SCREEN: 51.6 mm
GEST. AGE ON COLLECTION DATE: 11.9 wk
MATERNAL AGE AT EDD: 26 a
NUMBER OF FETUSES: 1
Nuchal Translucency (NT): 1.1 mm
PAPP-A Value: 661.2 ng/mL
Weight: 142 [lb_av]

## 2016-01-16 ENCOUNTER — Encounter: Payer: Medicaid Other | Admitting: Women's Health

## 2016-01-17 ENCOUNTER — Encounter: Payer: Self-pay | Admitting: Women's Health

## 2016-01-17 ENCOUNTER — Ambulatory Visit (INDEPENDENT_AMBULATORY_CARE_PROVIDER_SITE_OTHER): Payer: Medicaid Other | Admitting: Women's Health

## 2016-01-17 VITALS — BP 104/70 | HR 96 | Wt 142.0 lb

## 2016-01-17 DIAGNOSIS — O99322 Drug use complicating pregnancy, second trimester: Secondary | ICD-10-CM

## 2016-01-17 DIAGNOSIS — Z363 Encounter for antenatal screening for malformations: Secondary | ICD-10-CM

## 2016-01-17 DIAGNOSIS — Z331 Pregnant state, incidental: Secondary | ICD-10-CM

## 2016-01-17 DIAGNOSIS — R87619 Unspecified abnormal cytological findings in specimens from cervix uteri: Secondary | ICD-10-CM

## 2016-01-17 DIAGNOSIS — Z1389 Encounter for screening for other disorder: Secondary | ICD-10-CM

## 2016-01-17 DIAGNOSIS — Z3682 Encounter for antenatal screening for nuchal translucency: Secondary | ICD-10-CM

## 2016-01-17 DIAGNOSIS — Z3482 Encounter for supervision of other normal pregnancy, second trimester: Secondary | ICD-10-CM

## 2016-01-17 DIAGNOSIS — Z3A16 16 weeks gestation of pregnancy: Secondary | ICD-10-CM

## 2016-01-17 DIAGNOSIS — Z3492 Encounter for supervision of normal pregnancy, unspecified, second trimester: Secondary | ICD-10-CM

## 2016-01-17 DIAGNOSIS — F1290 Cannabis use, unspecified, uncomplicated: Secondary | ICD-10-CM

## 2016-01-17 LAB — POCT URINALYSIS DIPSTICK
Blood, UA: NEGATIVE
GLUCOSE UA: NEGATIVE
Ketones, UA: NEGATIVE
Leukocytes, UA: NEGATIVE
NITRITE UA: NEGATIVE
Protein, UA: NEGATIVE

## 2016-01-17 NOTE — Patient Instructions (Signed)

## 2016-01-17 NOTE — Progress Notes (Signed)
Low-risk OB appointment G1P0 562w0d Estimated Date of Delivery: 07/03/16 BP 104/70   Pulse 96   Wt 142 lb (64.4 kg)   LMP 09/27/2015 (Exact Date)   BMI 24.37 kg/m   BP, weight, and urine reviewed.  Refer to obstetrical flow sheet for FH & FHR.  No fm yet. Denies cramping, lof, vb, or uti s/s. No complaints. Discussed +THC, last used in June.  Reviewed warning s/s to report. Plan:  Continue routine obstetrical care  F/U in 3wks for OB appointment and anatomy u/s 2nd IT today

## 2016-01-22 LAB — MATERNAL SCREEN, INTEGRATED #2
ADSF: 1.13
AFP MoM: 1.08
Alpha-Fetoprotein: 34 ng/mL
Crown Rump Length: 51.6 mm
DIA MOM: 1.36
DIA VALUE: 252.6 pg/mL
ESTRIOL UNCONJUGATED: 0.9 ng/mL
Gest. Age on Collection Date: 11.9 weeks
Gestational Age: 15.9 weeks
Maternal Age at EDD: 26 years
NUCHAL TRANSLUCENCY MOM: 0.79
NUMBER OF FETUSES: 1
Nuchal Translucency (NT): 1.1 mm
Open Spina Bifida: 1:10000 {titer}
PAPP-A MoM: 0.84
PAPP-A Value: 661.2 ng/mL
PDF: 0
Test Results:: NEGATIVE
Weight: 142 [lb_av]
Weight: 142 [lb_av]
hCG MoM: 1.49
hCG Value: 56.9 IU/mL

## 2016-02-08 ENCOUNTER — Ambulatory Visit (INDEPENDENT_AMBULATORY_CARE_PROVIDER_SITE_OTHER): Payer: Medicaid Other

## 2016-02-08 ENCOUNTER — Ambulatory Visit (INDEPENDENT_AMBULATORY_CARE_PROVIDER_SITE_OTHER): Payer: Medicaid Other | Admitting: Obstetrics and Gynecology

## 2016-02-08 VITALS — BP 106/80 | HR 90 | Wt 146.2 lb

## 2016-02-08 DIAGNOSIS — Z3A20 20 weeks gestation of pregnancy: Secondary | ICD-10-CM

## 2016-02-08 DIAGNOSIS — Z3490 Encounter for supervision of normal pregnancy, unspecified, unspecified trimester: Secondary | ICD-10-CM

## 2016-02-08 DIAGNOSIS — Z36 Encounter for antenatal screening of mother: Secondary | ICD-10-CM | POA: Diagnosis not present

## 2016-02-08 DIAGNOSIS — Z3402 Encounter for supervision of normal first pregnancy, second trimester: Secondary | ICD-10-CM

## 2016-02-08 DIAGNOSIS — Z331 Pregnant state, incidental: Secondary | ICD-10-CM

## 2016-02-08 DIAGNOSIS — Z1389 Encounter for screening for other disorder: Secondary | ICD-10-CM

## 2016-02-08 DIAGNOSIS — Z3482 Encounter for supervision of other normal pregnancy, second trimester: Secondary | ICD-10-CM

## 2016-02-08 DIAGNOSIS — Z363 Encounter for antenatal screening for malformations: Secondary | ICD-10-CM

## 2016-02-08 LAB — POCT URINALYSIS DIPSTICK
Blood, UA: NEGATIVE
Glucose, UA: NEGATIVE
KETONES UA: NEGATIVE
LEUKOCYTES UA: NEGATIVE
Nitrite, UA: NEGATIVE
Protein, UA: NEGATIVE

## 2016-02-08 NOTE — Progress Notes (Signed)
Patient ID: Alicia KernJessica Small, female   DOB: March 04, 1991, 25 y.o.   MRN: [id number]  G1P0  Estimated Date of Delivery: 07/03/16 LROB 865w1d  Blood pressure 106/80, pulse 90, weight 146 lb 3.2 oz (66.3 kg), last menstrual period 09/27/2015.    Urine results: notable for none  Chief Complaint  Patient presents with  . Routine Prenatal Visit    Ultrasound today    Patient complaints: none. Pt states she plans on breast feeding.   Patient reports good fetal movement. She denies any bleeding, rupture of membranes, or regular contractions.   Refer to the ob flow sheet for FH and FHR.    Physical Examination: General appearance - alert, well appearing, and in no distress                                      Abdomen - FH U+0,                                                         -FHR 135 bpm                                                         soft, nontender, nondistended, no masses or organomegaly                                            Questions were answered. Assessment: LROB G1P0 @ 6865w1d  >14 wk US today anatomy scan Strongly encouraged pt and partner to attend childbirth classes   Plan:  Continued routine obstetrical care  F/u in 4 weeks for routine prenatal care   By signing my name below, I, Doreatha MartinEva Mathews, attest that this documentation has been prepared under the direction and in the presence of Tilda BurrowJohn V Sherwood Castilla, MD. Electronically Signed: Doreatha MartinEva Mathews, ED Scribe. 02/08/16. 2:30 PM.  I personally performed the services described in this documentation, which was SCRIBED in my presence. The recorded information has been reviewed and considered accurate. It has been edited as necessary during review. Tilda BurrowFERGUSON,Kayliee Atienza V, MD

## 2016-02-08 NOTE — Progress Notes (Signed)
US 19+1 wks,cephalic,ant pl gr 0,normal ov's bilat,cx 3.1 cm,svp of fluid 4.8 cm,fhr 135 bpm,efw 270 g,anatomy complete,no obvious abnormalities seen

## 2016-03-06 ENCOUNTER — Encounter: Payer: Medicaid Other | Admitting: Women's Health

## 2016-03-22 ENCOUNTER — Telehealth: Payer: Self-pay | Admitting: *Deleted

## 2016-03-22 ENCOUNTER — Encounter: Payer: Self-pay | Admitting: Obstetrics and Gynecology

## 2016-03-22 ENCOUNTER — Ambulatory Visit (INDEPENDENT_AMBULATORY_CARE_PROVIDER_SITE_OTHER): Payer: Medicaid Other | Admitting: Obstetrics and Gynecology

## 2016-03-22 VITALS — BP 118/72 | HR 94 | Wt 155.0 lb

## 2016-03-22 DIAGNOSIS — Z3403 Encounter for supervision of normal first pregnancy, third trimester: Secondary | ICD-10-CM

## 2016-03-22 DIAGNOSIS — Z1389 Encounter for screening for other disorder: Secondary | ICD-10-CM

## 2016-03-22 DIAGNOSIS — O26893 Other specified pregnancy related conditions, third trimester: Secondary | ICD-10-CM

## 2016-03-22 DIAGNOSIS — N898 Other specified noninflammatory disorders of vagina: Secondary | ICD-10-CM

## 2016-03-22 DIAGNOSIS — Z331 Pregnant state, incidental: Secondary | ICD-10-CM

## 2016-03-22 LAB — POCT URINALYSIS DIPSTICK
Blood, UA: NEGATIVE
Glucose, UA: NEGATIVE
Ketones, UA: NEGATIVE
Leukocytes, UA: NEGATIVE
NITRITE UA: NEGATIVE

## 2016-03-22 NOTE — Telephone Encounter (Signed)
Pt c/o intense abdominal pain that extends to legs x 2-3 days, +FM, no vaginal bleeding.

## 2016-03-22 NOTE — Progress Notes (Signed)
Patient ID: Alicia Small, female DOB: 1991/01/12, 25 y.o.   MRN: [id number]  G1P0 4723w2d [facility]: [facility]  Patient reports good fetal movement, denies any bleeding and no rupture of membranes symptoms or regular contractions. Patient complaints: suprapubic abdominal pain x 2 days worsening yesterday. Pt notes that there is a pressure sensation primarily to her LLQ >RLQ that is alleviated when she applies pressure to the area. Pt reports that her last sexual intercourse was over 1 week ago. Pt notes that this is her first pregnancy. 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  Pt has not tried any medications for the relief of her symptoms. Pt denies vaginal spotting/bleeding, vaginal discharge, and any other symptoms.  Blood pressure 118/72, last menstrual period 09/27/2015. refer to the ob flow sheet for FH and FHR, also BP, Wt, Urine results:notable for trace of protein, otherwise negative                          Physical Examination: General appearance - alert, well appearing, and in no distress and oriented to person, place, and time Mental status - alert, oriented to person, place, and time, normal mood, behavior, speech, dress, motor activity, and thought processes Pelvic - normal external genitalia, vulva, vagina, cervix, uterus and adnexa,  VULVA: normal appearing vulva with no masses, tenderness or lesions,  VAGINA: normal appearing vagina with normal color and discharge, no lesions, general clear discharge ffn COLLECTED CERVIX: normal appearing cervix without discharge or lesions, long, closed, posterior, UTERUS: uterus is normal size, shape, consistency and nontender,  ADNEXA: normal adnexa in size, nontender and no masses                                            Questions were answered. Assessment: LROB G1P0 @ 5123w2d                         Round ligament pains                        No evidence PTL Plan:  Continued routine obstetrical care, round  ligament pain, pelvic pressure, no evidence preterm labor         F/u FFN F/u as scheduled for routine OB visit.   By signing my name below, I, Soijett Blue, attest that this documentation has been prepared under the direction and in the presence of Tilda BurrowJohn V Rashaun Wichert, MD. Electronically Signed: Soijett Blue, ED Scribe. 03/22/16. 2:16 PM.  I personally performed the services described in this documentation, which was SCRIBED in my presence. The recorded information has been reviewed and considered accurate. It has been edited as necessary during review. Tilda BurrowFERGUSON,Tosh Glaze V, MD

## 2016-03-23 LAB — FETAL FIBRONECTIN: Fetal Fibronectin: NEGATIVE

## 2016-03-26 ENCOUNTER — Ambulatory Visit (INDEPENDENT_AMBULATORY_CARE_PROVIDER_SITE_OTHER): Payer: Medicaid Other | Admitting: Women's Health

## 2016-03-26 ENCOUNTER — Encounter: Payer: Self-pay | Admitting: Women's Health

## 2016-03-26 VITALS — BP 102/62 | HR 88 | Wt 156.0 lb

## 2016-03-26 DIAGNOSIS — Z1389 Encounter for screening for other disorder: Secondary | ICD-10-CM

## 2016-03-26 DIAGNOSIS — Z331 Pregnant state, incidental: Secondary | ICD-10-CM

## 2016-03-26 DIAGNOSIS — Z3A26 26 weeks gestation of pregnancy: Secondary | ICD-10-CM

## 2016-03-26 DIAGNOSIS — Z3402 Encounter for supervision of normal first pregnancy, second trimester: Secondary | ICD-10-CM

## 2016-03-26 LAB — POCT URINALYSIS DIPSTICK
Blood, UA: NEGATIVE
GLUCOSE UA: NEGATIVE
KETONES UA: NEGATIVE
LEUKOCYTES UA: NEGATIVE
Nitrite, UA: NEGATIVE
PROTEIN UA: NEGATIVE

## 2016-03-26 NOTE — Progress Notes (Signed)
Low-risk OB appointment G1P0 4688w6d Estimated Date of Delivery: 07/03/16 BP 102/62   Pulse 88   Wt 156 lb (70.8 kg)   LMP 09/27/2015 (Exact Date)   BMI 26.78 kg/m   BP, weight, and urine reviewed.  Refer to obstetrical flow sheet for FH & FHR.  Reports good fm.  Denies regular uc's, lof, vb, or uti s/s. No complaints. CF hasn't been drawn, states she still wants, offered today or can do w/ pn2 next visit- wants to do next visit.  Reviewed ptl s/s, fm. Plan:  Continue routine obstetrical care  F/U in 2wks for OB appointment and pn2 w/ CF Declined flu shot

## 2016-03-26 NOTE — Patient Instructions (Signed)
[place] Pediatricians/Family Doctors:  [facility] [phone]            [facility] [phone]                 [facility] [phone] (usually not accepting new patients unless you have family there already, you are always welcome to call and ask)            [facility] ([address] [place]) [phone]   [place] Pediatricians/Family Doctors:   [facility]: [phone]  [facility]: [phone]   You will have your sugar test next visit.  Please do not eat or drink anything after midnight the night before you come, not even water.  You will be here for at least two hours.     Call the office ([phone]) or go to [facility] if:  You begin to have strong, frequent contractions  Your water breaks.  Sometimes it is a big gush of fluid, sometimes it is just a trickle that keeps getting your panties wet or running down your legs  You have vaginal bleeding.  It is normal to have a small amount of spotting if your cervix was checked.   You don't feel your baby moving like normal.  If you don't, get you something to eat and drink and lay down and focus on feeling your baby move.   If your baby is still not moving like normal, you should call the office or go to [facility].  Second Trimester of Pregnancy The second trimester is from week 13 through week 28, months 4 through 6. The second trimester is often a time when you feel your best. Your body has also adjusted to being pregnant, and you begin to feel better physically. Usually, morning sickness has lessened or quit completely, you may have more energy, and you may have an increase in appetite. The second trimester is also a time when the fetus is growing rapidly. At the end of the sixth month, the fetus is about 9 inches long and weighs about 1 pounds. You will likely begin to feel the baby move (quickening) between 18 and 20 weeks  of the pregnancy. BODY CHANGES Your body goes through many changes during pregnancy. The changes vary from woman to woman.   Your weight will continue to increase. You will notice your lower abdomen bulging out.  You may begin to get stretch marks on your hips, abdomen, and breasts.  You may develop headaches that can be relieved by medicines approved by your health care provider.  You may urinate more often because the fetus is pressing on your bladder.  You may develop or continue to have heartburn as a result of your pregnancy.  You may develop constipation because certain hormones are causing the muscles that push waste through your intestines to slow down.  You may develop hemorrhoids or swollen, bulging veins (varicose veins).  You may have back pain because of the weight gain and pregnancy hormones relaxing your joints between the bones in your pelvis and as a result of a shift in weight and the muscles that support your balance.  Your breasts will continue to grow and be tender.  Your gums may bleed and may be sensitive to brushing and flossing.  Dark spots or blotches (chloasma, mask of pregnancy) may develop on your face. This will likely fade after the baby is born.  A dark line from your belly button to the pubic area (  area (linea nigra) may appear. This will likely fade after the baby is born.  You may have changes in your hair. These can include thickening of your hair, rapid growth, and changes in texture. Some women also have hair loss during or after pregnancy, or hair that feels dry or thin. Your hair will most likely return to normal after your baby is born. WHAT TO EXPECT AT YOUR PRENATAL VISITS During a routine prenatal visit:  You will be weighed to make sure you and the fetus are growing normally.  Your blood pressure will be taken.  Your abdomen will be measured to track your baby's growth.  The fetal heartbeat will be listened to.  Any test results  from the previous visit will be discussed. Your health care provider may ask you:  How you are feeling.  If you are feeling the baby move.  If you have had any abnormal symptoms, such as leaking fluid, bleeding, severe headaches, or abdominal cramping.  If you have any questions. Other tests that may be performed during your second trimester include:  Blood tests that check for:  Low iron levels (anemia).  Gestational diabetes (between 24 and 28 weeks).  Rh antibodies.  Urine tests to check for infections, diabetes, or protein in the urine.  An ultrasound to confirm the proper growth and development of the baby.  An amniocentesis to check for possible genetic problems.  Fetal screens for spina bifida and Down syndrome. HOME CARE INSTRUCTIONS   Avoid all smoking, herbs, alcohol, and unprescribed drugs. These chemicals affect the formation and growth of the baby.  Follow your health care provider's instructions regarding medicine use. There are medicines that are either safe or unsafe to take during pregnancy.  Exercise only as directed by your health care provider. Experiencing uterine cramps is a good sign to stop exercising.  Continue to eat regular, healthy meals.  Wear a good support bra for breast tenderness.  Do not use hot tubs, steam rooms, or saunas.  Wear your seat belt at all times when driving.  Avoid raw meat, uncooked cheese, cat litter boxes, and soil used by cats. These carry germs that can cause birth defects in the baby.  Take your prenatal vitamins.  Try taking a stool softener (if your health care provider approves) if you develop constipation. Eat more high-fiber foods, such as fresh vegetables or fruit and whole grains. Drink plenty of fluids to keep your urine clear or pale yellow.  Take warm sitz baths to soothe any pain or discomfort caused by hemorrhoids. Use hemorrhoid cream if your health care provider approves.  If you develop varicose  veins, wear support hose. Elevate your feet for 15 minutes, 3-4 times a day. Limit salt in your diet.  Avoid heavy lifting, wear low heel shoes, and practice good posture.  Rest with your legs elevated if you have leg cramps or low back pain.  Visit your dentist if you have not gone yet during your pregnancy. Use a soft toothbrush to brush your teeth and be gentle when you floss.  A sexual relationship may be continued unless your health care provider directs you otherwise.  Continue to go to all your prenatal visits as directed by your health care provider. SEEK MEDICAL CARE IF:   You have dizziness.  You have mild pelvic cramps, pelvic pressure, or nagging pain in the abdominal area.  You have persistent nausea, vomiting, or diarrhea.  You have a bad smelling vaginal discharge.  You have  pain with urination. SEEK IMMEDIATE MEDICAL CARE IF:   You have a fever.  You are leaking fluid from your vagina.  You have spotting or bleeding from your vagina.  You have severe abdominal cramping or pain.  You have rapid weight gain or loss.  You have shortness of breath with chest pain.  You notice sudden or extreme swelling of your face, hands, ankles, feet, or legs.  You have not felt your baby move in over an hour.  You have severe headaches that do not go away with medicine.  You have vision changes. Document Released: 05/15/2001 Document Revised: 05/26/2013 Document Reviewed: 07/22/2012 ExitCare Patient Information 2015 [employer]. This information is not intended to replace advice given to you by your health care provider. Make sure you discuss any questions you have with your health care provider.     

## 2016-04-09 ENCOUNTER — Other Ambulatory Visit: Payer: Medicaid Other

## 2016-04-09 ENCOUNTER — Encounter: Payer: Self-pay | Admitting: Women's Health

## 2016-04-09 ENCOUNTER — Ambulatory Visit (INDEPENDENT_AMBULATORY_CARE_PROVIDER_SITE_OTHER): Payer: Medicaid Other | Admitting: Women's Health

## 2016-04-09 VITALS — BP 112/78 | HR 92 | Wt 160.0 lb

## 2016-04-09 DIAGNOSIS — Z331 Pregnant state, incidental: Secondary | ICD-10-CM

## 2016-04-09 DIAGNOSIS — O99322 Drug use complicating pregnancy, second trimester: Secondary | ICD-10-CM

## 2016-04-09 DIAGNOSIS — Z3402 Encounter for supervision of normal first pregnancy, second trimester: Secondary | ICD-10-CM

## 2016-04-09 DIAGNOSIS — Z3A28 28 weeks gestation of pregnancy: Secondary | ICD-10-CM

## 2016-04-09 DIAGNOSIS — Z1389 Encounter for screening for other disorder: Secondary | ICD-10-CM

## 2016-04-09 DIAGNOSIS — F1290 Cannabis use, unspecified, uncomplicated: Secondary | ICD-10-CM

## 2016-04-09 DIAGNOSIS — Z131 Encounter for screening for diabetes mellitus: Secondary | ICD-10-CM

## 2016-04-09 DIAGNOSIS — Z3682 Encounter for antenatal screening for nuchal translucency: Secondary | ICD-10-CM

## 2016-04-09 DIAGNOSIS — Z3483 Encounter for supervision of other normal pregnancy, third trimester: Secondary | ICD-10-CM

## 2016-04-09 LAB — POCT URINALYSIS DIPSTICK
Blood, UA: NEGATIVE
Glucose, UA: NEGATIVE
Ketones, UA: NEGATIVE
LEUKOCYTES UA: NEGATIVE
Nitrite, UA: NEGATIVE
PROTEIN UA: NEGATIVE

## 2016-04-09 NOTE — Patient Instructions (Addendum)

## 2016-04-09 NOTE — Progress Notes (Signed)
Low-risk OB appointment G1P0 8153w6d Estimated Date of Delivery: 07/03/16 BP 112/78   Pulse 92   Wt 160 lb (72.6 kg)   LMP 09/27/2015 (Exact Date)   BMI 27.46 kg/m   BP, weight reviewed. Unable to void right now, will try again before she leaves. Refer to obstetrical flow sheet for FH & FHR.  Reports good fm.  Denies regular uc's, lof, vb, or uti s/s. No complaints. Reviewed ptl s/s, fkc. Recommended Tdap at HD/PCP per CDC guidelines.  Plan:  Continue routine obstetrical care  F/U in 4wks for OB appointment  PN2 today, CF drawn also

## 2016-04-10 LAB — PMP SCREEN PROFILE (10S), URINE
AMPHETAMINE SCRN UR: NEGATIVE ng/mL
BARBITURATE SCRN UR: NEGATIVE ng/mL
Benzodiazepine Screen, Urine: NEGATIVE ng/mL
CANNABINOIDS UR QL SCN: POSITIVE ng/mL
Cocaine(Metab.)Screen, Urine: NEGATIVE ng/mL
Creatinine(Crt), U: 166.8 mg/dL (ref 20.0–300.0)
METHADONE SCREEN, URINE: NEGATIVE ng/mL
Opiate Scrn, Ur: NEGATIVE ng/mL
Oxycodone+Oxymorphone Ur Ql Scn: NEGATIVE ng/mL
PCP SCRN UR: NEGATIVE ng/mL
PH UR, DRUG SCRN: 6.1 (ref 4.5–8.9)
PROPOXYPHENE SCREEN: NEGATIVE ng/mL

## 2016-04-16 LAB — CBC
Hematocrit: 33.6 % — ABNORMAL LOW (ref 34.0–46.6)
Hemoglobin: 11.6 g/dL (ref 11.1–15.9)
MCH: 30.2 pg (ref 26.6–33.0)
MCHC: 34.5 g/dL (ref 31.5–35.7)
MCV: 88 fL (ref 79–97)
Platelets: 319 10*3/uL (ref 150–379)
RBC: 3.84 x10E6/uL (ref 3.77–5.28)
RDW: 13.1 % (ref 12.3–15.4)
WBC: 9.5 10*3/uL (ref 3.4–10.8)

## 2016-04-16 LAB — CYSTIC FIBROSIS MUTATION 97: Interpretation: NOT DETECTED

## 2016-04-16 LAB — ANTIBODY SCREEN: ANTIBODY SCREEN: NEGATIVE

## 2016-04-16 LAB — RPR: RPR Ser Ql: NONREACTIVE

## 2016-04-16 LAB — HIV ANTIBODY (ROUTINE TESTING W REFLEX): HIV SCREEN 4TH GENERATION: NONREACTIVE

## 2016-04-16 LAB — GLUCOSE TOLERANCE, 2 HOURS W/ 1HR
GLUCOSE, 1 HOUR: 84 mg/dL (ref 65–179)
Glucose, 2 hour: 81 mg/dL (ref 65–152)
Glucose, Fasting: 77 mg/dL (ref 65–91)

## 2016-05-08 ENCOUNTER — Ambulatory Visit (INDEPENDENT_AMBULATORY_CARE_PROVIDER_SITE_OTHER): Payer: Medicaid Other | Admitting: Obstetrics & Gynecology

## 2016-05-08 VITALS — BP 118/74 | HR 95 | Wt 167.0 lb

## 2016-05-08 DIAGNOSIS — Z3403 Encounter for supervision of normal first pregnancy, third trimester: Secondary | ICD-10-CM

## 2016-05-08 DIAGNOSIS — Z331 Pregnant state, incidental: Secondary | ICD-10-CM

## 2016-05-08 DIAGNOSIS — Z1389 Encounter for screening for other disorder: Secondary | ICD-10-CM

## 2016-05-08 DIAGNOSIS — Z3A32 32 weeks gestation of pregnancy: Secondary | ICD-10-CM

## 2016-05-08 LAB — POCT URINALYSIS DIPSTICK
Blood, UA: NEGATIVE
GLUCOSE UA: NEGATIVE
Ketones, UA: NEGATIVE
LEUKOCYTES UA: NEGATIVE
Nitrite, UA: NEGATIVE
PROTEIN UA: NEGATIVE

## 2016-05-08 NOTE — Progress Notes (Signed)
G1P0 8245w0d Estimated Date of Delivery: 07/03/16  Blood pressure 118/74, pulse 95, weight 167 lb (75.8 kg), last menstrual period 09/27/2015.   BP weight and urine results all reviewed and noted.  Please refer to the obstetrical flow sheet for the fundal height and fetal heart rate documentation:  Patient reports good fetal movement, denies any bleeding and no rupture of membranes symptoms or regular contractions. Patient is without complaints. All questions were answered.  Orders Placed This Encounter  Procedures  . POCT urinalysis dipstick    Plan:  Continued routine obstetrical care,   Return in about 2 weeks (around 05/22/2016) for LROB.

## 2016-05-22 ENCOUNTER — Encounter: Payer: Self-pay | Admitting: Obstetrics & Gynecology

## 2016-05-22 ENCOUNTER — Ambulatory Visit (INDEPENDENT_AMBULATORY_CARE_PROVIDER_SITE_OTHER): Payer: Medicaid Other | Admitting: Obstetrics & Gynecology

## 2016-05-22 VITALS — BP 120/90 | HR 84 | Wt 165.0 lb

## 2016-05-22 DIAGNOSIS — Z1389 Encounter for screening for other disorder: Secondary | ICD-10-CM

## 2016-05-22 DIAGNOSIS — Z3403 Encounter for supervision of normal first pregnancy, third trimester: Secondary | ICD-10-CM

## 2016-05-22 DIAGNOSIS — Z3A32 32 weeks gestation of pregnancy: Secondary | ICD-10-CM

## 2016-05-22 DIAGNOSIS — Z331 Pregnant state, incidental: Secondary | ICD-10-CM

## 2016-05-22 LAB — POCT URINALYSIS DIPSTICK
Blood, UA: NEGATIVE
Glucose, UA: NEGATIVE
KETONES UA: NEGATIVE
LEUKOCYTES UA: NEGATIVE
NITRITE UA: NEGATIVE
Protein, UA: NEGATIVE

## 2016-05-22 NOTE — Progress Notes (Signed)
G1P0 4874w0d Estimated Date of Delivery: 07/03/16  Blood pressure 120/90, pulse 84, weight 165 lb (74.8 kg), last menstrual period 09/27/2015.   BP weight and urine results all reviewed and noted.  Please refer to the obstetrical flow sheet for the fundal height and fetal heart rate documentation:  Patient reports good fetal movement, denies any bleeding and no rupture of membranes symptoms or regular contractions. Patient is without complaints. All questions were answered.  Orders Placed This Encounter  Procedures  . POCT urinalysis dipstick    Plan:  Continued routine obstetrical care, watch FH progression  No Follow-up on file.

## 2016-06-04 NOTE — L&D Delivery Note (Signed)
26 y.o. G1P0 at 7559w0d delivered a viable female infant in cephalic, LOA position. Anterior shoulder delivered with ease. 60 sec delayed cord clamping. Cord clamped x2 and cut. Placenta delivered spontaneously intact, with 3VC. Fundus firm on exam with massage and pitocin. Good hemostasis noted.  Laceration: 2nd degree perineal and bilateral periurethral Suture: 3.0 Vicryl for 2nd degree and 4.0 for periurethral lacerations Good hemostasis noted. EBL: 100cc  Mom and baby recovering in LDR.    Apgars: 8/9 Weight: pending    Renne Muscaaniel L Deontrey Massi, MD PGY-1 06/12/2016, 8:24 PM

## 2016-06-06 ENCOUNTER — Encounter: Payer: Self-pay | Admitting: Advanced Practice Midwife

## 2016-06-06 ENCOUNTER — Ambulatory Visit (INDEPENDENT_AMBULATORY_CARE_PROVIDER_SITE_OTHER): Payer: Medicaid Other | Admitting: Advanced Practice Midwife

## 2016-06-06 VITALS — BP 123/91 | HR 106 | Wt 163.0 lb

## 2016-06-06 DIAGNOSIS — Z1389 Encounter for screening for other disorder: Secondary | ICD-10-CM

## 2016-06-06 DIAGNOSIS — O133 Gestational [pregnancy-induced] hypertension without significant proteinuria, third trimester: Secondary | ICD-10-CM

## 2016-06-06 DIAGNOSIS — Z3A36 36 weeks gestation of pregnancy: Secondary | ICD-10-CM

## 2016-06-06 DIAGNOSIS — O139 Gestational [pregnancy-induced] hypertension without significant proteinuria, unspecified trimester: Secondary | ICD-10-CM | POA: Insufficient documentation

## 2016-06-06 DIAGNOSIS — O0993 Supervision of high risk pregnancy, unspecified, third trimester: Secondary | ICD-10-CM

## 2016-06-06 DIAGNOSIS — Z331 Pregnant state, incidental: Secondary | ICD-10-CM

## 2016-06-06 LAB — POCT URINALYSIS DIPSTICK
Blood, UA: NEGATIVE
Glucose, UA: NEGATIVE
KETONES UA: NEGATIVE
Leukocytes, UA: NEGATIVE
Nitrite, UA: NEGATIVE
PROTEIN UA: NEGATIVE

## 2016-06-06 NOTE — Patient Instructions (Signed)

## 2016-06-06 NOTE — Progress Notes (Signed)
G1P0 4962w1d Estimated Date of Delivery: 07/03/16  Blood pressure (!) 123/91, pulse (!) 106, last menstrual period 09/27/2015.   BP weight and urine results all reviewed and noted.  Please refer to the obstetrical flow sheet for the fundal height and fetal heart rate documentation: technically, pt has had 2 BPs that would label her as GHTN, but am very reluctant to induce w/these pressures.  Patient reports good fetal movement, denies any bleeding and no rupture of membranes symptoms or regular contractions. Patient is without complaints. All questions were answered.  Orders Placed This Encounter  Procedures  . POCT urinalysis dipstick    Plan:  Continued obstetrical care, will start twice weekly NST, IOL at 39 weeks or as clinically indicated  Return in 1 day (on 06/07/2016) for HROB, US:EFW, US:BPP.

## 2016-06-07 ENCOUNTER — Other Ambulatory Visit: Payer: Self-pay | Admitting: Advanced Practice Midwife

## 2016-06-07 ENCOUNTER — Ambulatory Visit (INDEPENDENT_AMBULATORY_CARE_PROVIDER_SITE_OTHER): Payer: Medicaid Other

## 2016-06-07 ENCOUNTER — Encounter (HOSPITAL_COMMUNITY): Payer: Self-pay | Admitting: *Deleted

## 2016-06-07 ENCOUNTER — Encounter: Payer: Self-pay | Admitting: Obstetrics and Gynecology

## 2016-06-07 ENCOUNTER — Telehealth (HOSPITAL_COMMUNITY): Payer: Self-pay | Admitting: *Deleted

## 2016-06-07 ENCOUNTER — Ambulatory Visit (INDEPENDENT_AMBULATORY_CARE_PROVIDER_SITE_OTHER): Payer: Medicaid Other | Admitting: Obstetrics and Gynecology

## 2016-06-07 VITALS — BP 143/97 | HR 94 | Wt 171.0 lb

## 2016-06-07 DIAGNOSIS — Z3685 Encounter for antenatal screening for Streptococcus B: Secondary | ICD-10-CM

## 2016-06-07 DIAGNOSIS — O133 Gestational [pregnancy-induced] hypertension without significant proteinuria, third trimester: Secondary | ICD-10-CM | POA: Diagnosis not present

## 2016-06-07 DIAGNOSIS — Z3483 Encounter for supervision of other normal pregnancy, third trimester: Secondary | ICD-10-CM

## 2016-06-07 DIAGNOSIS — Z1389 Encounter for screening for other disorder: Secondary | ICD-10-CM

## 2016-06-07 DIAGNOSIS — Z3A36 36 weeks gestation of pregnancy: Secondary | ICD-10-CM

## 2016-06-07 DIAGNOSIS — Z3403 Encounter for supervision of normal first pregnancy, third trimester: Secondary | ICD-10-CM

## 2016-06-07 DIAGNOSIS — Z331 Pregnant state, incidental: Secondary | ICD-10-CM | POA: Diagnosis not present

## 2016-06-07 DIAGNOSIS — O365931 Maternal care for other known or suspected poor fetal growth, third trimester, fetus 1: Secondary | ICD-10-CM

## 2016-06-07 LAB — POCT URINALYSIS DIPSTICK
Blood, UA: NEGATIVE
Glucose, UA: NEGATIVE
Ketones, UA: NEGATIVE
Leukocytes, UA: NEGATIVE
Nitrite, UA: NEGATIVE
Protein, UA: NEGATIVE

## 2016-06-07 LAB — OB RESULTS CONSOLE GBS: GBS: NEGATIVE

## 2016-06-07 NOTE — Progress Notes (Signed)
US 36+2 wks,cephalic,BPP 8/8,fhr 138 bpm,ant pl gr 3,afi 9 cm,RI .58,.61,S/D 2.54,2.38,EFW 2126 g Williams <10%,Hadlock <3%,discussed results w/Dr Emelda FearFerguson.

## 2016-06-07 NOTE — Progress Notes (Signed)
High Risk Pregnancy HROB Diagnosis(es):   IUGR, Gest htn  G1P0 6495w2d Estimated Date of Delivery: 07/03/16    HPI: The patient is being seen today for ongoing management of above . Today she reports good fm deniews headache or scotoma Patient reports good fetal movement, denies any bleeding and no rupture of membranes symptoms or regular contractions.   BP weight and urine results reviewed and noted. Blood pressure (!) 143/97, pulse 94, weight 171 lb (77.6 kg), last menstrual period 09/27/2015.  Fundal Height:  30  Fetal Heart rate:  138 Physical Examination: Abdomen - soft, nontender, nondistended, no masses or organomegaly Small uterine size for dates                                     Pelvic - VULVA: normal appearing vulva with no masses, tenderness or lesions, VAGINA: normal appearing vagina with normal color and discharge, no lesions, CERVIX: normal appearing cervix without discharge or lesions, gc and chl collected and, GBS                                     Edema:  none  Urinalysis:NEGATIVE for protein glucose                 POSITIVE for none  Fetal Surveillance Testing today:  BPP 8.8, good dopplers, Growth <10% with all parameters symmetric at less than 2.3%ile   Lab and sonogram results have been reviewed. Comments: except IUGR   Assessment:  1.  Pregnancy at 4095w2d,  G1P0   :  IUGR, GEST HTN                        2.                          3.   Medication(s) Plans:  none  Treatment Plan:  1. PIH labs,                             2 biweekly testing                             3 IOL at 37 wk given the 2 concurrent problems of GHTN and IUGR  Follow up in 0.5 weeks for appointment for high risk OB care, nst                      IOL set up for 7:30 am on 37wk Tues.

## 2016-06-07 NOTE — Telephone Encounter (Signed)
Preadmission screen  

## 2016-06-08 LAB — COMPREHENSIVE METABOLIC PANEL
A/G RATIO: 1.3 (ref 1.2–2.2)
ALT: 15 IU/L (ref 0–32)
AST: 16 IU/L (ref 0–40)
Albumin: 3.4 g/dL — ABNORMAL LOW (ref 3.5–5.5)
Alkaline Phosphatase: 106 IU/L (ref 39–117)
BILIRUBIN TOTAL: 0.2 mg/dL (ref 0.0–1.2)
BUN / CREAT RATIO: 8 — AB (ref 9–23)
BUN: 5 mg/dL — ABNORMAL LOW (ref 6–20)
CHLORIDE: 101 mmol/L (ref 96–106)
CO2: 21 mmol/L (ref 18–29)
Calcium: 8.8 mg/dL (ref 8.7–10.2)
Creatinine, Ser: 0.64 mg/dL (ref 0.57–1.00)
GFR calc non Af Amer: 124 mL/min/{1.73_m2} (ref >59)
GFR, EST AFRICAN AMERICAN: 143 mL/min/{1.73_m2} (ref >59)
Globulin, Total: 2.6 g/dL (ref 1.5–4.5)
Glucose: 71 mg/dL (ref 65–99)
POTASSIUM: 4.4 mmol/L (ref 3.5–5.2)
Sodium: 136 mmol/L (ref 134–144)
TOTAL PROTEIN: 6 g/dL (ref 6.0–8.5)

## 2016-06-08 LAB — CBC
Hematocrit: 37 % (ref 34.0–46.6)
Hemoglobin: 12.6 g/dL (ref 11.1–15.9)
MCH: 30.7 pg (ref 26.6–33.0)
MCHC: 34.1 g/dL (ref 31.5–35.7)
MCV: 90 fL (ref 79–97)
PLATELETS: 295 10*3/uL (ref 150–379)
RBC: 4.11 x10E6/uL (ref 3.77–5.28)
RDW: 13.1 % (ref 12.3–15.4)
WBC: 10.4 10*3/uL (ref 3.4–10.8)

## 2016-06-08 LAB — PROTEIN / CREATININE RATIO, URINE
Creatinine, Urine: 155.6 mg/dL
Protein, Ur: 21.4 mg/dL
Protein/Creat Ratio: 138 mg/g creat (ref 0–200)

## 2016-06-09 LAB — STREP GP B NAA+RFLX: Strep Gp B NAA+Rflx: NEGATIVE

## 2016-06-11 ENCOUNTER — Encounter: Payer: Self-pay | Admitting: Obstetrics & Gynecology

## 2016-06-11 ENCOUNTER — Ambulatory Visit (INDEPENDENT_AMBULATORY_CARE_PROVIDER_SITE_OTHER): Payer: Medicaid Other | Admitting: Obstetrics & Gynecology

## 2016-06-11 VITALS — BP 130/83 | HR 102 | Wt 170.0 lb

## 2016-06-11 DIAGNOSIS — Z1389 Encounter for screening for other disorder: Secondary | ICD-10-CM

## 2016-06-11 DIAGNOSIS — Z3A37 37 weeks gestation of pregnancy: Secondary | ICD-10-CM | POA: Diagnosis not present

## 2016-06-11 DIAGNOSIS — Z331 Pregnant state, incidental: Secondary | ICD-10-CM

## 2016-06-11 DIAGNOSIS — O365931 Maternal care for other known or suspected poor fetal growth, third trimester, fetus 1: Secondary | ICD-10-CM

## 2016-06-11 DIAGNOSIS — O133 Gestational [pregnancy-induced] hypertension without significant proteinuria, third trimester: Secondary | ICD-10-CM | POA: Diagnosis not present

## 2016-06-11 DIAGNOSIS — O0993 Supervision of high risk pregnancy, unspecified, third trimester: Secondary | ICD-10-CM

## 2016-06-11 LAB — POCT URINALYSIS DIPSTICK
Blood, UA: NEGATIVE
Glucose, UA: NEGATIVE
KETONES UA: NEGATIVE
LEUKOCYTES UA: NEGATIVE
Nitrite, UA: NEGATIVE
PROTEIN UA: NEGATIVE

## 2016-06-11 NOTE — Progress Notes (Signed)
Fetal Surveillance Testing today:  Reactive NST   High Risk Pregnancy Diagnosis(es):   IUGR, borderline gestational hypertension  G1P0 8160w6d Estimated Date of Delivery: 07/03/16  Blood pressure 130/83, pulse (!) 102, weight 170 lb (77.1 kg), last menstrual period 09/27/2015.  Urinalysis: Negative   HPI: The patient is being seen today for ongoing management of as above. Today she reports no problems   BP weight and urine results all reviewed and noted. Patient reports good fetal movement, denies any bleeding and no rupture of membranes symptoms or regular contractions.  Fundal Height:  32 Fetal Heart rate:  135 Edema:  none  Patient is without complaints other than noted in her HPI. All questions were answered.  All lab and sonogram results have been reviewed. Comments:    Assessment:  1.  Pregnancy at 7260w6d,  Estimated Date of Delivery: 07/03/16 :                          2.  IUGR                        3.  nborderline BP  Medication(s) Plans:    Treatment Plan:  Induction scheduled for tomorrow  Return in about 10 days (around 06/21/2016) for BP check. for appointment for high risk OB care  No orders of the defined types were placed in this encounter.  Orders Placed This Encounter  Procedures  . POCT urinalysis dipstick

## 2016-06-12 ENCOUNTER — Encounter (HOSPITAL_COMMUNITY): Payer: Self-pay

## 2016-06-12 ENCOUNTER — Inpatient Hospital Stay (HOSPITAL_COMMUNITY)
Admission: RE | Admit: 2016-06-12 | Discharge: 2016-06-14 | DRG: 775 | Disposition: A | Discharge status: Home / Self Care | Payer: Medicaid Other | Source: Ambulatory Visit | Attending: Family Medicine | Admitting: Family Medicine

## 2016-06-12 ENCOUNTER — Inpatient Hospital Stay (HOSPITAL_COMMUNITY): Payer: Medicaid Other | Admitting: Anesthesiology

## 2016-06-12 DIAGNOSIS — Z3A37 37 weeks gestation of pregnancy: Secondary | ICD-10-CM

## 2016-06-12 DIAGNOSIS — Z823 Family history of stroke: Secondary | ICD-10-CM | POA: Diagnosis not present

## 2016-06-12 DIAGNOSIS — Z833 Family history of diabetes mellitus: Secondary | ICD-10-CM

## 2016-06-12 DIAGNOSIS — O365990 Maternal care for other known or suspected poor fetal growth, unspecified trimester, not applicable or unspecified: Secondary | ICD-10-CM | POA: Diagnosis present

## 2016-06-12 DIAGNOSIS — O99324 Drug use complicating childbirth: Secondary | ICD-10-CM | POA: Diagnosis present

## 2016-06-12 DIAGNOSIS — O365930 Maternal care for other known or suspected poor fetal growth, third trimester, not applicable or unspecified: Secondary | ICD-10-CM | POA: Diagnosis present

## 2016-06-12 DIAGNOSIS — F1290 Cannabis use, unspecified, uncomplicated: Secondary | ICD-10-CM | POA: Diagnosis present

## 2016-06-12 DIAGNOSIS — O133 Gestational [pregnancy-induced] hypertension without significant proteinuria, third trimester: Secondary | ICD-10-CM

## 2016-06-12 DIAGNOSIS — O139 Gestational [pregnancy-induced] hypertension without significant proteinuria, unspecified trimester: Secondary | ICD-10-CM | POA: Diagnosis present

## 2016-06-12 DIAGNOSIS — Z8249 Family history of ischemic heart disease and other diseases of the circulatory system: Secondary | ICD-10-CM | POA: Diagnosis not present

## 2016-06-12 DIAGNOSIS — O134 Gestational [pregnancy-induced] hypertension without significant proteinuria, complicating childbirth: Principal | ICD-10-CM | POA: Diagnosis present

## 2016-06-12 DIAGNOSIS — Z3403 Encounter for supervision of normal first pregnancy, third trimester: Secondary | ICD-10-CM

## 2016-06-12 LAB — GC/CHLAMYDIA PROBE AMP
Chlamydia trachomatis, NAA: NEGATIVE
NEISSERIA GONORRHOEAE BY PCR: NEGATIVE

## 2016-06-12 LAB — TYPE AND SCREEN
ABO/RH(D): B POS
Antibody Screen: NEGATIVE

## 2016-06-12 LAB — COMPREHENSIVE METABOLIC PANEL
ALT: 20 U/L (ref 14–54)
ANION GAP: 8 (ref 5–15)
AST: 22 U/L (ref 15–41)
Albumin: 2.9 g/dL — ABNORMAL LOW (ref 3.5–5.0)
Alkaline Phosphatase: 102 U/L (ref 38–126)
BUN: 8 mg/dL (ref 6–20)
CHLORIDE: 105 mmol/L (ref 101–111)
CO2: 23 mmol/L (ref 22–32)
CREATININE: 0.61 mg/dL (ref 0.44–1.00)
Calcium: 8.6 mg/dL — ABNORMAL LOW (ref 8.9–10.3)
Glucose, Bld: 89 mg/dL (ref 65–99)
POTASSIUM: 4.5 mmol/L (ref 3.5–5.1)
SODIUM: 136 mmol/L (ref 135–145)
Total Bilirubin: 0.7 mg/dL (ref 0.3–1.2)
Total Protein: 6.3 g/dL — ABNORMAL LOW (ref 6.5–8.1)

## 2016-06-12 LAB — PROTEIN / CREATININE RATIO, URINE: CREATININE, URINE: 56 mg/dL

## 2016-06-12 LAB — CBC
HCT: 35 % — ABNORMAL LOW (ref 36.0–46.0)
HEMOGLOBIN: 12.1 g/dL (ref 12.0–15.0)
MCH: 30.7 pg (ref 26.0–34.0)
MCHC: 34.6 g/dL (ref 30.0–36.0)
MCV: 88.8 fL (ref 78.0–100.0)
Platelets: 284 10*3/uL (ref 150–400)
RBC: 3.94 MIL/uL (ref 3.87–5.11)
RDW: 13.3 % (ref 11.5–15.5)
WBC: 11.4 10*3/uL — ABNORMAL HIGH (ref 4.0–10.5)

## 2016-06-12 LAB — ABO/RH: ABO/RH(D): B POS

## 2016-06-12 MED ORDER — EPHEDRINE 5 MG/ML INJ
10.0000 mg | INTRAVENOUS | Status: DC | PRN
Start: 2016-06-12 — End: 2016-06-12
  Filled 2016-06-12: qty 4

## 2016-06-12 MED ORDER — DIBUCAINE 1 % RE OINT
1.0000 | TOPICAL_OINTMENT | RECTAL | Status: DC | PRN
Start: 2016-06-12 — End: 2016-06-14

## 2016-06-12 MED ORDER — ACETAMINOPHEN 325 MG PO TABS
650.0000 mg | ORAL_TABLET | ORAL | Status: DC | PRN
  Administered 2016-06-13 – 2016-06-14 (×2): 650 mg via ORAL
  Filled 2016-06-12 (×2): qty 2

## 2016-06-12 MED ORDER — PHENYLEPHRINE 40 MCG/ML (10ML) SYRINGE FOR IV PUSH (FOR BLOOD PRESSURE SUPPORT)
80.0000 ug | PREFILLED_SYRINGE | INTRAVENOUS | Status: DC | PRN
  Filled 2016-06-12: qty 10
  Filled 2016-06-12: qty 5

## 2016-06-12 MED ORDER — COCONUT OIL OIL: 1.0000 "application " | TOPICAL_OIL | Status: DC | PRN

## 2016-06-12 MED ORDER — OXYTOCIN 40 UNITS IN LACTATED RINGERS INFUSION - SIMPLE MED: 2.5000 [IU]/h | INTRAVENOUS | Status: DC

## 2016-06-12 MED ORDER — DIPHENHYDRAMINE HCL 50 MG/ML IJ SOLN: 12.5000 mg | INTRAMUSCULAR | Status: DC | PRN

## 2016-06-12 MED ORDER — ONDANSETRON HCL 4 MG/2ML IJ SOLN: 4.0000 mg | Freq: Four times a day (QID) | INTRAMUSCULAR | Status: DC | PRN

## 2016-06-12 MED ORDER — SENNOSIDES-DOCUSATE SODIUM 8.6-50 MG PO TABS
2.0000 | ORAL_TABLET | ORAL | Status: DC
  Administered 2016-06-13 (×2): 2 via ORAL
  Filled 2016-06-12 (×2): qty 2

## 2016-06-12 MED ORDER — FLEET ENEMA 7-19 GM/118ML RE ENEM: 1.0000 | ENEMA | RECTAL | Status: DC | PRN

## 2016-06-12 MED ORDER — IBUPROFEN 600 MG PO TABS
600.0000 mg | ORAL_TABLET | Freq: Four times a day (QID) | ORAL | Status: DC
  Administered 2016-06-13 – 2016-06-14 (×7): 600 mg via ORAL
  Filled 2016-06-12 (×7): qty 1

## 2016-06-12 MED ORDER — LIDOCAINE HCL (PF) 1 % IJ SOLN
INTRAMUSCULAR | Status: DC | PRN
Start: 2016-06-12 — End: 2016-06-12
  Administered 2016-06-12: 7 mL via EPIDURAL
  Administered 2016-06-12: 6 mL via EPIDURAL

## 2016-06-12 MED ORDER — WITCH HAZEL-GLYCERIN EX PADS: 1.0000 "application " | MEDICATED_PAD | CUTANEOUS | Status: DC | PRN

## 2016-06-12 MED ORDER — PHENYLEPHRINE 40 MCG/ML (10ML) SYRINGE FOR IV PUSH (FOR BLOOD PRESSURE SUPPORT)
80.0000 ug | PREFILLED_SYRINGE | INTRAVENOUS | Status: DC | PRN
  Filled 2016-06-12: qty 5

## 2016-06-12 MED ORDER — FENTANYL CITRATE (PF) 100 MCG/2ML IJ SOLN
50.0000 ug | INTRAMUSCULAR | Status: DC | PRN
  Administered 2016-06-12: 50 ug via INTRAVENOUS
  Filled 2016-06-12: qty 2

## 2016-06-12 MED ORDER — ACETAMINOPHEN 325 MG PO TABS: 650.0000 mg | ORAL_TABLET | ORAL | Status: DC | PRN

## 2016-06-12 MED ORDER — EPHEDRINE 5 MG/ML INJ
10.0000 mg | INTRAVENOUS | Status: DC | PRN
  Filled 2016-06-12: qty 4

## 2016-06-12 MED ORDER — TERBUTALINE SULFATE 1 MG/ML IJ SOLN
0.2500 mg | Freq: Once | INTRAMUSCULAR | Status: DC | PRN
  Filled 2016-06-12: qty 1

## 2016-06-12 MED ORDER — ZOLPIDEM TARTRATE 5 MG PO TABS
5.0000 mg | ORAL_TABLET | Freq: Every evening | ORAL | Status: DC | PRN
Start: 2016-06-12 — End: 2016-06-14

## 2016-06-12 MED ORDER — ONDANSETRON HCL 4 MG PO TABS: 4.0000 mg | ORAL_TABLET | ORAL | Status: DC | PRN

## 2016-06-12 MED ORDER — SOD CITRATE-CITRIC ACID 500-334 MG/5ML PO SOLN: 30.0000 mL | ORAL | Status: DC | PRN

## 2016-06-12 MED ORDER — TETANUS-DIPHTH-ACELL PERTUSSIS 5-2.5-18.5 LF-MCG/0.5 IM SUSP
0.5000 mL | Freq: Once | INTRAMUSCULAR | Status: AC
  Administered 2016-06-14: 0.5 mL via INTRAMUSCULAR

## 2016-06-12 MED ORDER — SIMETHICONE 80 MG PO CHEW: 80.0000 mg | CHEWABLE_TABLET | ORAL | Status: DC | PRN

## 2016-06-12 MED ORDER — FENTANYL 2.5 MCG/ML BUPIVACAINE 1/10 % EPIDURAL INFUSION (WH - ANES)
14.0000 mL/h | INTRAMUSCULAR | Status: DC | PRN
  Administered 2016-06-12: 14 mL/h via EPIDURAL
  Filled 2016-06-12: qty 100

## 2016-06-12 MED ORDER — ONDANSETRON HCL 4 MG/2ML IJ SOLN: 4.0000 mg | INTRAMUSCULAR | Status: DC | PRN

## 2016-06-12 MED ORDER — TERBUTALINE SULFATE 1 MG/ML IJ SOLN
0.2500 mg | Freq: Once | INTRAMUSCULAR | Status: DC | PRN
Start: 2016-06-12 — End: 2016-06-12
  Filled 2016-06-12: qty 1

## 2016-06-12 MED ORDER — LACTATED RINGERS IV SOLN
INTRAVENOUS | Status: DC
  Administered 2016-06-12 (×2): via INTRAVENOUS

## 2016-06-12 MED ORDER — PRENATAL MULTIVITAMIN CH
1.0000 | ORAL_TABLET | Freq: Every day | ORAL | Status: DC
  Administered 2016-06-13 – 2016-06-14 (×2): 1 via ORAL
  Filled 2016-06-12 (×2): qty 1

## 2016-06-12 MED ORDER — LIDOCAINE HCL (PF) 1 % IJ SOLN
30.0000 mL | INTRAMUSCULAR | Status: AC | PRN
  Administered 2016-06-12: 30 mL via SUBCUTANEOUS
  Filled 2016-06-12: qty 30

## 2016-06-12 MED ORDER — OXYCODONE-ACETAMINOPHEN 5-325 MG PO TABS: 1.0000 | ORAL_TABLET | ORAL | Status: DC | PRN

## 2016-06-12 MED ORDER — LACTATED RINGERS IV SOLN: 500.0000 mL | INTRAVENOUS | Status: DC | PRN

## 2016-06-12 MED ORDER — OXYTOCIN 40 UNITS IN LACTATED RINGERS INFUSION - SIMPLE MED
1.0000 m[IU]/min | INTRAVENOUS | Status: DC
Start: 2016-06-12 — End: 2016-06-12
  Administered 2016-06-12: 2 m[IU]/min via INTRAVENOUS
  Filled 2016-06-12: qty 1000

## 2016-06-12 MED ORDER — MISOPROSTOL 25 MCG QUARTER TABLET
25.0000 ug | ORAL_TABLET | ORAL | Status: DC | PRN
  Administered 2016-06-12: 25 ug via VAGINAL
  Filled 2016-06-12: qty 1
  Filled 2016-06-12: qty 0.25

## 2016-06-12 MED ORDER — DIPHENHYDRAMINE HCL 25 MG PO CAPS: 25.0000 mg | ORAL_CAPSULE | Freq: Four times a day (QID) | ORAL | Status: DC | PRN

## 2016-06-12 MED ORDER — OXYCODONE-ACETAMINOPHEN 5-325 MG PO TABS: 2.0000 | ORAL_TABLET | ORAL | Status: DC | PRN

## 2016-06-12 MED ORDER — BENZOCAINE-MENTHOL 20-0.5 % EX AERO
1.0000 "application " | INHALATION_SPRAY | CUTANEOUS | Status: DC | PRN
  Filled 2016-06-12: qty 56

## 2016-06-12 MED ORDER — OXYTOCIN BOLUS FROM INFUSION: 500.0000 mL | Freq: Once | INTRAVENOUS | Status: DC

## 2016-06-12 MED ORDER — LACTATED RINGERS IV SOLN
500.0000 mL | Freq: Once | INTRAVENOUS | Status: AC
  Administered 2016-06-12: 500 mL via INTRAVENOUS

## 2016-06-12 NOTE — Anesthesia Pain Management Evaluation Note (Signed)
  CRNA Pain Management Visit Note  Patient: Alicia KernJessica Paschal, 26 y.o., female  "[facility]. We have an anesthesia team available at all times to provide care throughout the hospital, including epidural management and anesthesia for C-section. I don't know your plan for the delivery whether it a natural birth, water birth, IV sedation, nitrous supplementation, doula or epidural, but we want to meet your pain goals."   1.Was your pain managed to your expectations on prior hospitalizations?   No prior hospitalizations  2.What is your expectation for pain management during this hospitalization?     Epidural  3.How can we help you reach that goal? Epidural just placed  Record the patient's initial score and the patient's pain goal.   Pain: 2  Pain Goal: 10 [facility] you to be able to say your pain was always managed very well.  Serenah Mill 06/12/2016

## 2016-06-12 NOTE — Anesthesia Preprocedure Evaluation (Signed)
Anesthesia Evaluation  Patient identified by MRN, date of birth, ID band Patient awake    Reviewed: Allergy & Precautions, H&P , NPO status , Patient's Chart, lab work & pertinent test results  Airway Mallampati: I  TM Distance: >3 FB Neck ROM: full    Dental no notable dental hx.    Pulmonary neg pulmonary ROS,    Pulmonary exam normal        Cardiovascular hypertension, Normal cardiovascular exam     Neuro/Psych negative neurological ROS  negative psych ROS   GI/Hepatic negative GI ROS, Neg liver ROS,   Endo/Other  negative endocrine ROS  Renal/GU negative Renal ROS     Musculoskeletal   Abdominal Normal abdominal exam  (+)   Peds  Hematology negative hematology ROS (+)   Anesthesia Other Findings   Reproductive/Obstetrics (+) Pregnancy                             Anesthesia Physical Anesthesia Plan  ASA: II  Anesthesia Plan: Epidural   Post-op Pain Management:    Induction:   Airway Management Planned:   Additional Equipment:   Intra-op Plan:   Post-operative Plan:   Informed Consent: I have reviewed the patients History and Physical, chart, labs and discussed the procedure including the risks, benefits and alternatives for the proposed anesthesia with the patient or authorized representative who has indicated his/her understanding and acceptance.     Plan Discussed with:   Anesthesia Plan Comments:         Anesthesia Quick Evaluation  

## 2016-06-12 NOTE — Progress Notes (Signed)
LABOR PROGRESS NOTE  Alicia Small is a 26 y.o. G1P0 at 7539w0d  admitted for iol for iugr and gthtn3  Subjective: No pain  Objective: BP 135/88   Pulse 100   Temp 98 F (36.7 C) (Oral)   Resp 17   Ht 5\' 4"  (1.626 m)   Wt 170 lb (77.1 kg)   LMP 09/27/2015 (Exact Date)   BMI 29.18 kg/m  or  Vitals:   06/12/16 0814 06/12/16 1026  BP: 136/90 135/88  Pulse: 94 100  Resp: 18 17  Temp: 98.5 F (36.9 C) 98 F (36.7 C)  TempSrc: Oral Oral  Weight: 170 lb (77.1 kg)   Height: 5\' 4"  (1.626 m)     145/mod/-a/-d Dilation: 1 Effacement (%): 60, 70 Cervical Position: Posterior Station: -2 Presentation: Vertex Exam by:: stone rnc  Labs: Lab Results  Component Value Date   WBC 11.4 (H) 06/12/2016   HGB 12.1 06/12/2016   HCT 35.0 (L) 06/12/2016   MCV 88.8 06/12/2016   PLT 284 06/12/2016    Patient Active Problem List   Diagnosis Date Noted  . IUGR (intrauterine growth restriction) affecting care of mother 06/12/2016  . Gestational hypertension 06/06/2016  . Marijuana use 12/21/2015  . Rubella non-immune status, antepartum 12/20/2015  . Supervision of normal first pregnancy 12/19/2015  . Abnormal Pap smear of cervix 07/27/2013  . Pelvic pain in female 07/27/2013    Assessment / Plan: 26 y.o. G1P0 at 8139w0d here for iol   Labor: foley placed, inadvertent arom clear. cytotec placed Fetal Wellbeing:  Cat 1 Pain Control:  Iv narcotics Anticipated MOD:  Vag Ghtn: no s/s severe disease  [name], MD 06/12/2016, 11:37 AM

## 2016-06-12 NOTE — H&P (Signed)
LABOR AND DELIVERY ADMISSION HISTORY AND PHYSICAL NOTE  Alicia Small is a 26 y.o. female G1P0 with IUP at 47w0dseen by [place]  Seen by Dr. EElonda Huskyyesterday in clinic and was noted to have a reactive NST and normal exam. She has high risk pregnancy diagnoses including IUGR and borderline gestational hypertension.    She reports positive fetal movement. She denies leakage of fluid or vaginal bleeding.  Prenatal History/Complications:  Past Medical History: Past Medical History:  Diagnosis Date  . Abnormal vaginal Pap smear   . Pregnancy induced hypertension     Past Surgical History: Past Surgical History:  Procedure Laterality Date  . NO PAST SURGERIES      Obstetrical History: OB History    Gravida Para Term Preterm AB Living   1 0           SAB TAB Ectopic Multiple Live Births                  Social History: Social History   Social History  . Marital status: Single    Spouse name: N/A  . Number of children: N/A  . Years of education: N/A   Social History Main Topics  . Smoking status: Never Smoker  . Smokeless tobacco: Never Used  . Alcohol use No     Comment: rarely; not now  . Drug use: No     Comment: not now  . Sexual activity: Yes    Birth control/ protection: None   Other Topics Concern  . None   Social History Narrative  . None    Family History: Family History  Problem Relation Age of Onset  . Hypertension Mother   . Diabetes Maternal Grandfather   . Cancer Maternal Grandfather     lung  . Stroke Maternal Grandfather   . Other Paternal Grandfather     polyps  . Hypertension Father   . Autism Brother   . Asthma Maternal Grandmother   . Thyroid disease Maternal Grandmother     Allergies: Allergies  Allergen Reactions  . Penicillins Hives    Prescriptions Prior to Admission  Medication Sig Dispense Refill Last Dose  . Prenatal Vit-Fe Fumarate-FA (PRENATAL VITAMIN PO) Take by mouth.   06/11/2016 at  Unknown time  . HYDROcodone-acetaminophen (NORCO) 5-325 MG tablet Take 1 tablet by mouth every 6 (six) hours as needed for moderate pain.   Taking     Review of Systems   All systems reviewed and negative except as stated in HPI  Blood pressure 136/90, pulse 94, temperature 98.5 F (36.9 C), temperature source Oral, resp. rate 18, height '5\' 4"'  (1.626 m), weight 77.1 kg (170 lb), last menstrual period 09/27/2015. General appearance: alert and cooperative Lungs: clear to auscultation bilaterally Heart: regular rate and rhythm Abdomen: soft, non-tender; bowel sounds normal Extremities: No calf swelling or tenderness Presentation: cephalic, vertex Fetal monitoring: FHR baseline 145-150, moderate variability, no decels, intermittent contractions 4-5 min, currently spaced out Uterine activity:      Prenatal labs: ABO, Rh: B/Positive/-- (07/17 1510) Antibody: Negative (11/06 0913) Rubella: Non-immune-- MMR PP RPR: Non Reactive (11/06 0913)  HBsAg: Negative (07/17 1510)  HIV: Non Reactive (11/06 0913)  GBS: Negative (01/04 0000)  1 hr Glucola: 2hr normal Genetic screening:  negative Anatomy [place] normal female  Prenatal Transfer Tool  Maternal Diabetes: No Genetic Screening: Normal Maternal Ultrasounds/Referrals: Normal Fetal Ultrasounds or other Referrals:  None Maternal Substance Abuse:  No Significant Maternal  Medications:  None Significant Maternal Lab Results: None  Results for orders placed or performed during the hospital encounter of 06/12/16 (from the past 24 hour(s))  CBC   Collection Time: 06/12/16  9:07 AM  Result Value Ref Range   WBC 11.4 (H) 4.0 - 10.5 K/uL   RBC 3.94 3.87 - 5.11 MIL/uL   Hemoglobin 12.1 12.0 - 15.0 g/dL   HCT 35.0 (L) 36.0 - 46.0 %   MCV 88.8 78.0 - 100.0 fL   MCH 30.7 26.0 - 34.0 pg   MCHC 34.6 30.0 - 36.0 g/dL   RDW 13.3 11.5 - 15.5 %   Platelets 284 150 - 400 K/uL  Results for orders placed or performed in visit on 06/11/16 (from the  past 24 hour(s))  POCT urinalysis dipstick   Collection Time: 06/11/16  9:41 AM  Result Value Ref Range   Color, UA     Clarity, UA     Glucose, UA neg    Bilirubin, UA     Ketones, UA neg    Spec Grav, UA     Blood, UA neg    pH, UA     Protein, UA neg    Urobilinogen, UA     Nitrite, UA neg    Leukocytes, UA Negative Negative    Patient Active Problem List   Diagnosis Date Noted  . IUGR (intrauterine growth restriction) affecting care of mother 06/12/2016  . Gestational hypertension 06/06/2016  . Marijuana use 12/21/2015  . Rubella non-immune status, antepartum 12/20/2015  . Supervision of normal first pregnancy 12/19/2015  . Abnormal Pap smear of cervix 07/27/2013  . Pelvic pain in female 07/27/2013    Assessment: Alicia Small is a 26 y.o. G1P0 at 74w0dhere for IOL at 37wk 2/2  IUGR and borderline gestational hypertension.    #Labor: IOL #Pain: IV pain medication/epidural #FWB: Category I #ID: GBS neg #MOF: Breast #MOC:Depo  DEloise Levels MD PGY-1 06/12/2016, 9:32 AM

## 2016-06-12 NOTE — Anesthesia Procedure Notes (Signed)
Epidural Patient location during procedure: OB Start time: 06/12/2016 1:30 PM End time: 06/12/2016 1:33 PM  Staffing Anesthesiologist: Leilani AbleHATCHETT, Maizey Menendez Performed: anesthesiologist   Preanesthetic Checklist Completed: patient identified, surgical consent, pre-op evaluation, timeout performed, IV checked, risks and benefits discussed and monitors and equipment checked  Epidural Patient position: sitting Prep: site prepped and draped and DuraPrep Patient monitoring: continuous pulse ox and blood pressure Approach: midline Location: L3-L4 Injection technique: LOR air  Needle:  Needle type: Tuohy  Needle gauge: 17 G Needle length: 9 cm and 9 Needle insertion depth: 5 cm cm Catheter type: closed end flexible Catheter size: 19 Gauge Catheter at skin depth: 10 cm Test dose: negative and Other  Assessment Sensory level: T9 Events: blood not aspirated, injection not painful, no injection resistance, negative IV test and no paresthesia  Additional Notes Reason for block:procedure for pain

## 2016-06-13 LAB — RPR: RPR Ser Ql: NONREACTIVE

## 2016-06-13 NOTE — Progress Notes (Signed)
POSTPARTUM PROGRESS NOTE  Post Partum Day 1 Subjective:  Alicia Small is a 26 y.o. G1P1001 4867w0d s/p SVD.  No acute events overnight.  Pt denies problems with ambulating, voiding or po intake.  She denies nausea or vomiting.  Pain is well controlled.  She has had flatus. She has not had bowel movement.  Lochia Minimal.   Objective: Blood pressure 118/83, pulse 91, temperature 98.1 F (36.7 C), temperature source Axillary, resp. rate 18, height 5\' 4"  (1.626 m), weight 77.1 kg (170 lb), last menstrual period 09/27/2015, SpO2 99 %, unknown if currently breastfeeding.  Physical Exam:  General: alert, cooperative and no distress Lochia:normal flow Chest: CTAB Heart: RRR no m/r/g Abdomen: +BS, soft, nontender,  Uterine Fundus: firm DVT Evaluation: No calf swelling or tenderness Extremities: no edema   Recent Labs  06/12/16 0907  HGB 12.1  HCT 35.0*    Assessment/Plan:  ASSESSMENT: Alicia Small is a 26 y.o. G1P1001 2967w0d s/p SVD  Plan for discharge tomorrow   LOS: 1 day   Renne Muscaaniel L Warden, MD PGY-1 [facility], [facility]  06/13/2016, 7:17 AM   OB FELLOW POSTPARTUM PROGRESS NOTE ATTESTATION  I have seen and examined this patient and agree with above documentation in the resident's note.   Ernestina PennaNicholas Schenk, MD 10:54 AM

## 2016-06-13 NOTE — Anesthesia Postprocedure Evaluation (Signed)
Anesthesia Post Note  Patient: Alicia Small  Procedure(s) Performed: * No procedures listed *  Patient location during evaluation: Mother Baby Anesthesia Type: Epidural Level of consciousness: awake Pain management: pain level controlled Vital Signs Assessment: post-procedure vital signs reviewed and stable Respiratory status: spontaneous breathing Cardiovascular status: stable Postop Assessment: no headache, no backache, epidural receding and patient able to bend at knees Anesthetic complications: no        Last Vitals:  Vitals:   06/12/16 2315 06/13/16 0300  BP: 124/77 118/83  Pulse: (!) 106 91  Resp: 18 18  Temp: 37.3 C 36.7 C    Last Pain:  Vitals:   06/13/16 0300  TempSrc: Axillary  PainSc: 0-No pain   Pain Goal: Patients Stated Pain Goal: 7 (06/12/16 [id number])               Edison PaceWILKERSON,Cejay Cambre

## 2016-06-13 NOTE — Lactation Note (Signed)
This note was copied from a baby's chart. Lactation Consultation Note  Patient Name: Girl Lucretia KernJessica Paschal ZOXWR'UToday's Date: 06/13/2016 Reason for consult: Initial assessment  Visited with 1st time Mom and FOB, baby 15 hrs old.  Baby delivered early due to borderline maternal gestational hypertension, and IUGR.  Baby delivered at 37 weeks, weighing 4 lbs 10 oz.  After initial 5 minute sleepy BF, Mom started pumping and supplementing with EBM by slow flow bottle, and has 4 feedings so far (18, 5, 3, and 3 ml)  Mom and baby sleeping/resting.  Encouraged STS and feeding often on cue, or waking every 3 hrs if baby sleeping.  Talked about importance of frequent pumping, and praised her to starting so early. Reviewed normal LPI newborn behavior with regards to breastfeeding. Encouraged breast massage, and hand expression along with use of DEBP.  Recommended volume of 5-10 ml for first 24 hrs, as green LPT handout explains.  Brochure given with information on IP and OP lactation services available.  Encouraged Mom to call for assistance as needed.  Lactation to follow up prn and in am.  Consult Status Consult Status: Follow-up Date: 06/14/16 Follow-up type: In-patient    Judee ClaraSmith, Safwan Tomei E 06/13/2016, 11:53 AM

## 2016-06-13 NOTE — Progress Notes (Signed)
UR chart review completed.  

## 2016-06-14 MED ORDER — IBUPROFEN 600 MG PO TABS: 600.0000 mg | ORAL_TABLET | Freq: Four times a day (QID) | ORAL | 0 refills | Status: DC | PRN

## 2016-06-14 MED ORDER — MEASLES, MUMPS & RUBELLA VAC ~~LOC~~ INJ
0.5000 mL | INJECTION | Freq: Once | SUBCUTANEOUS | Status: AC
  Administered 2016-06-14: 0.5 mL via SUBCUTANEOUS
  Filled 2016-06-14: qty 0.5

## 2016-06-14 MED ORDER — DOCUSATE SODIUM 100 MG PO CAPS: 100.0000 mg | ORAL_CAPSULE | Freq: Two times a day (BID) | ORAL | 0 refills | Status: DC

## 2016-06-14 NOTE — Discharge Instructions (Signed)

## 2016-06-14 NOTE — Plan of Care (Signed)
Problem: Pain Managment: Goal: General experience of comfort will improve Outcome: Progressing Patient pain controlled with tylenol and ibuprofen. Complains of mild cramping and perineal pain. Dermoplast also given to patient - states that has helped with perineal pain.

## 2016-06-14 NOTE — Discharge Summary (Signed)
OB Discharge Summary     Patient Name: Alicia Small DOB: 1991-01-28 MRN: [id number]  Date of admission: 06/12/2016 Delivering MD: Renne MuscaWARDEN, DANIEL L   Date of discharge: 06/14/2016  Admitting diagnosis: induction Intrauterine pregnancy: 5869w0d     Secondary diagnosis:  Active Problems:   Gestational hypertension   IUGR (intrauterine growth restriction) affecting care of mother  Additional problems: none     Discharge diagnosis: Term Pregnancy Delivered                                                                                                Post partum procedures:None  Augmentation: Pitocin, Cytotec and Foley Balloon  Complications: None  Hospital course:  Induction of Labor With Vaginal Delivery   26 y.o. yo G1P1001 at 5569w0d was admitted to the hospital 06/12/2016 for induction of labor.  Indication for induction: Gestational hypertension and IUGR.  Patient had an uncomplicated labor course as follows: Membrane Rupture Time/Date: 10:45 AM ,06/12/2016   Intrapartum Procedures: Episiotomy: None [1]                                         Lacerations:  2nd degree [3];Periurethral [8]  Patient had delivery of a Viable infant.  Information for the patient's newborn:  Alicia Small, Girl [name] [[id number]][[id number]]  Delivery Method: Vag-Spont   06/12/2016  Details of delivery can be found in separate delivery note.  Patient had a routine postpartum course. Patient is discharged home 06/14/16.   Physical exam  Vitals:   06/13/16 1130 06/13/16 1800 06/13/16 2200 06/14/16 0544  BP: 122/79 126/86 122/83 124/87  Pulse: 90 99 92 97  Resp: 16 18 18 18   Temp: 98.1 F (36.7 C) 98 F (36.7 C) 98 F (36.7 C) 98.3 F (36.8 C)  TempSrc: Oral Oral Oral Oral  SpO2:   99%   Weight:      Height:       General: alert, cooperative and no distress Lochia: appropriate Uterine Fundus: firm Incision: N/A DVT Evaluation: No evidence of DVT seen on physical exam. Labs: Lab Results  Component Value  Date   WBC 11.4 (H) 06/12/2016   HGB 12.1 06/12/2016   HCT 35.0 (L) 06/12/2016   MCV 88.8 06/12/2016   PLT 284 06/12/2016   CMP Latest Ref Rng & Units 06/12/2016  Glucose 65 - 99 mg/dL 89  BUN 6 - 20 mg/dL 8  Creatinine 7.820.44 - 9.561.00 mg/dL 2.130.61  Sodium 086135 - 578145 mmol/L 136  Potassium 3.5 - 5.1 mmol/L 4.5  Chloride 101 - 111 mmol/L 105  CO2 22 - 32 mmol/L 23  Calcium 8.9 - 10.3 mg/dL 4.6(N8.6(L)  Total Protein 6.5 - 8.1 g/dL 6.3(L)  Total Bilirubin 0.3 - 1.2 mg/dL 0.7  Alkaline Phos 38 - 126 U/L 102  AST 15 - 41 U/L 22  ALT 14 - 54 U/L 20    Discharge instruction: per After Visit Summary and "Baby and Me Booklet".  After visit meds:  Allergies as of 06/14/2016  Reactions   Penicillins Hives   Has patient had a PCN reaction causing immediate rash, facial/tongue/throat swelling, SOB or lightheadedness with hypotension: no Has patient had a PCN reaction causing severe rash involving mucus membranes or skin necrosis:yes Has patient had a PCN reaction that required hospitalizationno Has patient had a PCN reaction occurring within the last 10 years no If all of the above answers are "NO", then may proceed with Cephalosporin use.      Medication List    TAKE these medications   docusate sodium 100 MG capsule Commonly known as:  COLACE Take 1 capsule (100 mg total) by mouth 2 (two) times daily.   ibuprofen 600 MG tablet Commonly known as:  ADVIL,MOTRIN Take 1 tablet (600 mg total) by mouth every 6 (six) hours as needed for mild pain or moderate pain.   PRENATAL VITAMIN PO Take by mouth.       Diet: routine diet  Activity: Advance as tolerated. Pelvic rest for 6 weeks.   Outpatient follow up:6 weeks Follow up Appt: Future Appointments Date Time Provider Department Center  06/21/2016 9:30 AM Cheral Marker, CNM [facility]   Follow up Visit:No Follow-up on file.  Postpartum contraception: Depo Provera  Newborn Data: Live born female  Birth Weight: 4 lb 10  oz (2098 g) APGAR: 8, 9  Baby Feeding: Breast Disposition:home with mother   06/14/2016 Renne Musca, MD   CNM attestation I have seen and examined this patient and agree with above documentation in the resident's note.   Alicia Small is a 26 y.o. G1P1001 s/p SVD.   Pain is well controlled.  Plan for birth control is Depo-Provera.  Method of Feeding: breast  PE:  BP 124/87 (BP Location: Right Arm)   Pulse 97   Temp 98.3 F (36.8 C) (Oral)   Resp 18   Ht 5\' 4"  (1.626 m)   Wt 77.1 kg (170 lb)   LMP 09/27/2015 (Exact Date)   SpO2 99%   Breastfeeding? Unknown   BMI 29.18 kg/m  Fundus firm   Recent Labs  06/12/16 0907  HGB 12.1  HCT 35.0*     Plan: discharge today - postpartum care discussed - f/u clinic in 6 weeks for postpartum visit   Cam Hai, CNM 9:40 AM  06/14/2016

## 2016-06-14 NOTE — Lactation Note (Signed)
This note was copied from a baby's chart. Lactation Consultation Note Changed to formula. No longer wishes to BF.  Patient Name: Alicia Small [id number]'WToday's Date: 06/14/2016     Maternal Data    Feeding Feeding Type: Bottle Fed - Formula Nipple Type: Slow - flow  LATCH Score/Interventions                      Lactation Tools Discussed/Used     Consult Status      [name] G 06/14/2016, 5:57 AM

## 2016-06-21 ENCOUNTER — Encounter: Payer: Medicaid Other | Admitting: Women's Health

## 2016-06-22 ENCOUNTER — Ambulatory Visit (INDEPENDENT_AMBULATORY_CARE_PROVIDER_SITE_OTHER): Payer: Medicaid Other | Admitting: Women's Health

## 2016-06-22 ENCOUNTER — Encounter: Payer: Self-pay | Admitting: Women's Health

## 2016-06-22 VITALS — BP 110/72 | HR 102 | Ht 64.0 in | Wt 159.0 lb

## 2016-06-22 DIAGNOSIS — Z0130 Encounter for examination of blood pressure without abnormal findings: Secondary | ICD-10-CM | POA: Diagnosis not present

## 2016-06-22 DIAGNOSIS — Z3009 Encounter for other general counseling and advice on contraception: Secondary | ICD-10-CM | POA: Diagnosis not present

## 2016-06-22 NOTE — Patient Instructions (Signed)
No sex until you get your birth control

## 2016-06-22 NOTE — Progress Notes (Signed)
   [facility]  Patient name: Alicia KernJessica Small MRN [id number]  Date of birth: 1990-10-02  CC & HPI:  Alicia KernJessica Small is a 26 y.o. 551P1001 Caucasian female 1wk postpartum presenting today for bp check. Had IOL at 37wks d/t IUGR and borderline GHTN. Not on any meds. Bottlefeeding. Plans depo.  No LMP recorded.  Pertinent History Reviewed:  Medical & Surgical Hx:   Past medical, surgical, family, and social history reviewed in electronic medical record Medications: Reviewed & Updated - see associated section Allergies: Reviewed in electronic medical record  Objective Findings:  Vitals: BP 110/72 (BP Location: Right Arm, Patient Position: Sitting, Cuff Size: Normal)   Pulse (!) 102   Ht 5\' 4"  (1.626 m)   Wt 159 lb (72.1 kg)   Breastfeeding? No   BMI 27.29 kg/m  Body mass index is 27.29 kg/m.  Physical Examination: General appearance - alert, well appearing, and in no distress  No results found for this or any previous visit (from the past 24 hour(s)).   Assessment & Plan:  A:   1wk s/p SVB after IOL for IUGR and borderline GHTN  BP check  Bottlefeeding  P:  BP great!  Discussed pre-e s/s, reasons to return  No sex until birth control, plans depo  Return in about 3 weeks (around 07/10/2016) for postpartum visit.  Marge DuncansBooker, Okla Qazi Randall CNM, [facility]NP-BC 06/22/2016 12:28 PM

## 2016-07-13 ENCOUNTER — Ambulatory Visit: Payer: Medicaid Other | Admitting: Women's Health

## 2016-07-19 ENCOUNTER — Encounter: Payer: Self-pay | Admitting: Women's Health

## 2016-07-19 ENCOUNTER — Ambulatory Visit (INDEPENDENT_AMBULATORY_CARE_PROVIDER_SITE_OTHER): Payer: Medicaid Other | Admitting: Women's Health

## 2016-07-19 DIAGNOSIS — Z8759 Personal history of other complications of pregnancy, childbirth and the puerperium: Secondary | ICD-10-CM | POA: Insufficient documentation

## 2016-07-19 MED ORDER — MEDROXYPROGESTERONE ACETATE 150 MG/ML IM SUSP: 150.0000 mg | INTRAMUSCULAR | 3 refills | Status: DC

## 2016-07-19 NOTE — Patient Instructions (Signed)
NO SEX UNTIL AFTER YOU GET YOUR BIRTH CONTROL   Medroxyprogesterone injection [Contraceptive] What is this medicine? MEDROXYPROGESTERONE (me DROX ee proe JES te rone) contraceptive injections prevent pregnancy. They provide effective birth control for 3 months. Depo-subQ Provera 104 is also used for treating pain related to endometriosis. This medicine may be used for other purposes; ask your health care provider or pharmacist if you have questions. COMMON BRAND NAME(S): Depo-Provera, Depo-subQ Provera 104 What should I tell my health care provider before I take this medicine? They need to know if you have any of these conditions: -frequently drink alcohol -asthma -blood vessel disease or a history of a blood clot in the lungs or legs -bone disease such as osteoporosis -breast cancer -diabetes -eating disorder (anorexia nervosa or bulimia) -high blood pressure -HIV infection or AIDS -kidney disease -liver disease -mental depression -migraine -seizures (convulsions) -stroke -tobacco smoker -vaginal bleeding -an unusual or allergic reaction to medroxyprogesterone, other hormones, medicines, foods, dyes, or preservatives -pregnant or trying to get pregnant -breast-feeding How should I use this medicine? Depo-Provera Contraceptive injection is given into a muscle. Depo-subQ Provera 104 injection is given under the skin. These injections are given by a health care professional. You must not be pregnant before getting an injection. The injection is usually given during the first 5 days after the start of a menstrual period or 6 weeks after delivery of a baby. Talk to your pediatrician regarding the use of this medicine in children. Special care may be needed. These injections have been used in female children who have started having menstrual periods. Overdosage: If you think you have taken too much of this medicine contact a poison control center or emergency room at once. NOTE: This  medicine is only for you. Do not share this medicine with others. What if I miss a dose? Try not to miss a dose. You must get an injection once every 3 months to maintain birth control. If you cannot keep an appointment, call and reschedule it. If you wait longer than 13 weeks between Depo-Provera contraceptive injections or longer than 14 weeks between Depo-subQ Provera 104 injections, you could get pregnant. Use another method for birth control if you miss your appointment. You may also need a pregnancy test before receiving another injection. What may interact with this medicine? Do not take this medicine with any of the following medications: -bosentan This medicine may also interact with the following medications: -aminoglutethimide -antibiotics or medicines for infections, especially rifampin, rifabutin, rifapentine, and griseofulvin -aprepitant -barbiturate medicines such as phenobarbital or primidone -bexarotene -carbamazepine -medicines for seizures like ethotoin, felbamate, oxcarbazepine, phenytoin, topiramate -modafinil -St. John's wort This list may not describe all possible interactions. Give your health care provider a list of all the medicines, herbs, non-prescription drugs, or dietary supplements you use. Also tell them if you smoke, drink alcohol, or use illegal drugs. Some items may interact with your medicine. What should I watch for while using this medicine? This drug does not protect you against HIV infection (AIDS) or other sexually transmitted diseases. Use of this product may cause you to lose calcium from your bones. Loss of calcium may cause weak bones (osteoporosis). Only use this product for more than 2 years if other forms of birth control are not right for you. The longer you use this product for birth control the more likely you will be at risk for weak bones. Ask your health care professional how you can keep strong bones. You may have a change   in bleeding pattern  or irregular periods. Many females stop having periods while taking this drug. If you have received your injections on time, your chance of being pregnant is very low. If you think you may be pregnant, see your health care professional as soon as possible. Tell your health care professional if you want to get pregnant within the next year. The effect of this medicine may last a long time after you get your last injection. What side effects may I notice from receiving this medicine? Side effects that you should report to your doctor or health care professional as soon as possible: -allergic reactions like skin rash, itching or hives, swelling of the face, lips, or tongue -breast tenderness or discharge -breathing problems -changes in vision -depression -feeling faint or lightheaded, falls -fever -pain in the abdomen, chest, groin, or leg -problems with balance, talking, walking -unusually weak or tired -yellowing of the eyes or skin Side effects that usually do not require medical attention (report to your doctor or health care professional if they continue or are bothersome): -acne -fluid retention and swelling -headache -irregular periods, spotting, or absent periods -temporary pain, itching, or skin reaction at site where injected -weight gain This list may not describe all possible side effects. Call your doctor for medical advice about side effects. You may report side effects to FDA at 1-800-FDA-1088. Where should I keep my medicine? This does not apply. The injection will be given to you by a health care professional. NOTE: This sheet is a summary. It may not cover all possible information. If you have questions about this medicine, talk to your doctor, pharmacist, or health care provider.  2017 Elsevier/Gold Standard (2008-06-11 18:37:56)

## 2016-07-19 NOTE — Progress Notes (Signed)
Subjective:    Alicia Small is a 26 y.o. [id number] Caucasian female who presents for a postpartum visit. She is 5 weeks postpartum following a spontaneous vaginal delivery at 37.0 gestational weeks after IOL for IUGR/GHTN. Anesthesia: epidural. I have fully reviewed the prenatal and intrapartum course. Postpartum course has been uncomplicated. Baby's course has been uncomplicated. Baby is feeding by bottle. Bleeding no bleeding. Bowel function is normal. Bladder function is normal. Patient is sexually active. Last sexual activity: 2/13. Contraception method is condoms. Postpartum depression screening: negative. Score 0.  Last pap 12/19/15 and was neg.  The following portions of the patient's history were reviewed and updated as appropriate: allergies, current medications, past medical history, past surgical history and problem list.  Review of Systems Pertinent items are noted in HPI.   Vitals:   07/19/16 1421  BP: 124/70  Pulse: 92  Weight: 170 lb (77.1 kg)  Height: 5\' 4"  (1.626 m)   No LMP recorded.  Objective:   General:  alert, cooperative and no distress   Breasts:  deferred, no complaints  Lungs: clear to auscultation bilaterally  Heart:  regular rate and rhythm  Abdomen: soft, nontender   Vulva: normal  Vagina: normal vagina  Cervix:  closed  Corpus: Well-involuted  Adnexa:  Non-palpable  Rectal Exam: No hemorrhoids        Assessment:   Postpartum exam 5 wks s/p SVB after IOL for IUGR and GHTN Bottlefeeding Depression screening Contraception counseling   Plan:  Contraception: abstinence until depo Follow up on 2/27 for depo (14d from last sex), then July for physical, or earlier if needed  Marge DuncansBooker, Alicia Small CNM, [facility]NP-BC 07/19/2016 2:50 PM

## 2016-07-31 ENCOUNTER — Ambulatory Visit (INDEPENDENT_AMBULATORY_CARE_PROVIDER_SITE_OTHER): Payer: Medicaid Other | Admitting: *Deleted

## 2016-07-31 ENCOUNTER — Encounter: Payer: Self-pay | Admitting: *Deleted

## 2016-07-31 DIAGNOSIS — Z3202 Encounter for pregnancy test, result negative: Secondary | ICD-10-CM

## 2016-07-31 DIAGNOSIS — Z3042 Encounter for surveillance of injectable contraceptive: Secondary | ICD-10-CM

## 2016-07-31 DIAGNOSIS — Z308 Encounter for other contraceptive management: Secondary | ICD-10-CM

## 2016-07-31 LAB — POCT URINE PREGNANCY: Preg Test, Ur: NEGATIVE

## 2016-07-31 MED ORDER — MEDROXYPROGESTERONE ACETATE 150 MG/ML IM SUSP
150.0000 mg | Freq: Once | INTRAMUSCULAR | Status: AC
  Administered 2016-07-31: 150 mg via INTRAMUSCULAR

## 2016-07-31 NOTE — Progress Notes (Signed)
Pt here for Depo. Pt tolerated shot well. Return in 12 weeks for next shot. JSY 

## 2016-10-23 ENCOUNTER — Ambulatory Visit: Payer: Medicaid Other

## 2017-01-03 ENCOUNTER — Telehealth: Payer: Self-pay | Admitting: *Deleted

## 2018-03-18 ENCOUNTER — Encounter: Payer: Self-pay | Admitting: Adult Health

## 2018-03-18 ENCOUNTER — Ambulatory Visit: Payer: Medicaid Other | Admitting: Adult Health

## 2018-03-18 VITALS — BP 132/88 | HR 99 | Ht 64.0 in | Wt 152.5 lb

## 2018-03-18 DIAGNOSIS — Z0001 Encounter for general adult medical examination with abnormal findings: Secondary | ICD-10-CM | POA: Insufficient documentation

## 2018-03-18 DIAGNOSIS — Z3A01 Less than 8 weeks gestation of pregnancy: Secondary | ICD-10-CM

## 2018-03-18 DIAGNOSIS — N926 Irregular menstruation, unspecified: Secondary | ICD-10-CM | POA: Diagnosis not present

## 2018-03-18 DIAGNOSIS — O3680X0 Pregnancy with inconclusive fetal viability, not applicable or unspecified: Secondary | ICD-10-CM

## 2018-03-18 DIAGNOSIS — R1031 Right lower quadrant pain: Secondary | ICD-10-CM | POA: Diagnosis not present

## 2018-03-18 DIAGNOSIS — R110 Nausea: Secondary | ICD-10-CM

## 2018-03-18 DIAGNOSIS — Z7689 Persons encountering health services in other specified circumstances: Secondary | ICD-10-CM | POA: Insufficient documentation

## 2018-03-18 DIAGNOSIS — Z3201 Encounter for pregnancy test, result positive: Secondary | ICD-10-CM | POA: Diagnosis not present

## 2018-03-18 LAB — POCT URINE PREGNANCY: Preg Test, Ur: POSITIVE — AB

## 2018-03-18 NOTE — Progress Notes (Signed)
  Subjective:     Patient ID: Alicia Small, female   DOB: 1990/08/03, 27 y.o.   MRN: [id number]  HPI [name] is a 27 year old white female in for UPT,She has missed a period and has had some pain in right side and nausea in the mornings.   Review of Systems =missed period Pain right side Some nausea in the am Reviewed past medical,surgical, social and family history. Reviewed medications and allergies.     Objective:   Physical Exam BP 132/88 (BP Location: Left Arm, Patient Position: Sitting, Cuff Size: Normal)   Pulse 99   Ht 5\' 4"  (1.626 m)   Wt 152 lb 8 oz (69.2 kg)   LMP 01/22/2018   Breastfeeding? No   BMI 26.18 kg/m +UPT, about 7+6 weeks by LMP, with EDD 10/30/18.Skin warm and dry. Neck: mid line trachea, normal thyroid, good ROM, no lymphadenopathy noted. Lungs: clear to ausculation bilaterally. Cardiovascular: regular rate and rhythm. Abdomen is soft and mildly tender RLQ, will get [place] in am.    Assessment:     1. Pregnancy examination or test, positive result   2. Less than [redacted] weeks gestation of pregnancy   3. Encounter to determine fetal viability of pregnancy, single or unspecified fetus       Plan:     Eat often Continue PNV Return in 1 day for dating [place] Return in 2 weeks for New OB Review handout on First trimester and by [facility]

## 2018-03-18 NOTE — Patient Instructions (Signed)
First Trimester of Pregnancy The first trimester of pregnancy is from week 1 until the end of week 13 (months 1 through 3). A week after a sperm fertilizes an egg, the egg will implant on the wall of the uterus. This embryo will begin to develop into a baby. Genes from you and your partner will form the baby. The female genes will determine whether the baby will be a boy or a girl. At 6-8 weeks, the eyes and face will be formed, and the heartbeat can be seen on ultrasound. At the end of 12 weeks, all the baby's organs will be formed. Now that you are pregnant, you will want to do everything you can to have a healthy baby. Two of the most important things are to get good prenatal care and to follow your health care provider's instructions. Prenatal care is all the medical care you receive before the baby's birth. This care will help prevent, find, and treat any problems during the pregnancy and childbirth. Body changes during your first trimester Your body goes through many changes during pregnancy. The changes vary from woman to woman.  You may gain or lose a couple of pounds at first.  You may feel sick to your stomach (nauseous) and you may throw up (vomit). If the vomiting is uncontrollable, call your health care provider.  You may tire easily.  You may develop headaches that can be relieved by medicines. All medicines should be approved by your health care provider.  You may urinate more often. Painful urination may mean you have a bladder infection.  You may develop heartburn as a result of your pregnancy.  You may develop constipation because certain hormones are causing the muscles that push stool through your intestines to slow down.  You may develop hemorrhoids or swollen veins (varicose veins).  Your breasts may begin to grow larger and become tender. Your nipples may stick out more, and the tissue that surrounds them (areola) may become darker.  Your gums may bleed and may be  sensitive to brushing and flossing.  Dark spots or blotches (chloasma, mask of pregnancy) may develop on your face. This will likely fade after the baby is born.  Your menstrual periods will stop.  You may have a loss of appetite.  You may develop cravings for certain kinds of food.  You may have changes in your emotions from day to day, such as being excited to be pregnant or being concerned that something may go wrong with the pregnancy and baby.  You may have more vivid and strange dreams.  You may have changes in your hair. These can include thickening of your hair, rapid growth, and changes in texture. Some women also have hair loss during or after pregnancy, or hair that feels dry or thin. Your hair will most likely return to normal after your baby is born.  What to expect at prenatal visits During a routine prenatal visit:  You will be weighed to make sure you and the baby are growing normally.  Your blood pressure will be taken.  Your abdomen will be measured to track your baby's growth.  The fetal heartbeat will be listened to between weeks 10 and 14 of your pregnancy.  Test results from any previous visits will be discussed.  Your health care provider may ask you:  How you are feeling.  If you are feeling the baby move.  If you have had any abnormal symptoms, such as leaking fluid, bleeding, severe headaches,   or abdominal cramping.  If you are using any tobacco products, including cigarettes, chewing tobacco, and electronic cigarettes.  If you have any questions.  Other tests that may be performed during your first trimester include:  Blood tests to find your blood type and to check for the presence of any previous infections. The tests will also be used to check for low iron levels (anemia) and protein on red blood cells (Rh antibodies). Depending on your risk factors, or if you previously had diabetes during pregnancy, you may have tests to check for high blood  sugar that affects pregnant women (gestational diabetes).  Urine tests to check for infections, diabetes, or protein in the urine.  An ultrasound to confirm the proper growth and development of the baby.  Fetal screens for spinal cord problems (spina bifida) and Down syndrome.  HIV (human immunodeficiency virus) testing. Routine prenatal testing includes screening for HIV, unless you choose not to have this test.  You may need other tests to make sure you and the baby are doing well.  Follow these instructions at home: Medicines  Follow your health care provider's instructions regarding medicine use. Specific medicines may be either safe or unsafe to take during pregnancy.  Take a prenatal vitamin that contains at least 600 micrograms (mcg) of folic acid.  If you develop constipation, try taking a stool softener if your health care provider approves. Eating and drinking  Eat a balanced diet that includes fresh fruits and vegetables, whole grains, good sources of protein such as meat, eggs, or tofu, and low-fat dairy. Your health care provider will help you determine the amount of weight gain that is right for you.  Avoid raw meat and uncooked cheese. These carry germs that can cause birth defects in the baby.  Eating four or five small meals rather than three large meals a day may help relieve nausea and vomiting. If you start to feel nauseous, eating a few soda crackers can be helpful. Drinking liquids between meals, instead of during meals, also seems to help ease nausea and vomiting.  Limit foods that are high in fat and processed sugars, such as fried and sweet foods.  To prevent constipation: ? Eat foods that are high in fiber, such as fresh fruits and vegetables, whole grains, and beans. ? Drink enough fluid to keep your urine clear or pale yellow. Activity  Exercise only as directed by your health care provider. Most women can continue their usual exercise routine during  pregnancy. Try to exercise for 30 minutes at least 5 days a week. Exercising will help you: ? Control your weight. ? Stay in shape. ? Be prepared for labor and delivery.  Experiencing pain or cramping in the lower abdomen or lower back is a good sign that you should stop exercising. Check with your health care provider before continuing with normal exercises.  Try to avoid standing for long periods of time. Move your legs often if you must stand in one place for a long time.  Avoid heavy lifting.  Wear low-heeled shoes and practice good posture.  You may continue to have sex unless your health care provider tells you not to. Relieving pain and discomfort  Wear a good support bra to relieve breast tenderness.  Take warm sitz baths to soothe any pain or discomfort caused by hemorrhoids. Use hemorrhoid cream if your health care provider approves.  Rest with your legs elevated if you have leg cramps or low back pain.  If you develop   varicose veins in your legs, wear support hose. Elevate your feet for 15 minutes, 3-4 times a day. Limit salt in your diet. Prenatal care  Schedule your prenatal visits by the twelfth week of pregnancy. They are usually scheduled monthly at first, then more often in the last 2 months before delivery.  Write down your questions. Take them to your prenatal visits.  Keep all your prenatal visits as told by your health care provider. This is important. Safety  Wear your seat belt at all times when driving.  Make a list of emergency phone numbers, including numbers for family, friends, the hospital, and police and fire departments. General instructions  Ask your health care provider for a referral to a local prenatal education class. Begin classes no later than the beginning of month 6 of your pregnancy.  Ask for help if you have counseling or nutritional needs during pregnancy. Your health care provider can offer advice or refer you to specialists for help  with various needs.  Do not use hot tubs, steam rooms, or saunas.  Do not douche or use tampons or scented sanitary pads.  Do not cross your legs for long periods of time.  Avoid cat litter boxes and soil used by cats. These carry germs that can cause birth defects in the baby and possibly loss of the fetus by miscarriage or stillbirth.  Avoid all smoking, herbs, alcohol, and medicines not prescribed by your health care provider. Chemicals in these products affect the formation and growth of the baby.  Do not use any products that contain nicotine or tobacco, such as cigarettes and e-cigarettes. If you need help quitting, ask your health care provider. You may receive counseling support and other resources to help you quit.  Schedule a dentist appointment. At home, brush your teeth with a soft toothbrush and be gentle when you floss. Contact a health care provider if:  You have dizziness.  You have mild pelvic cramps, pelvic pressure, or nagging pain in the abdominal area.  You have persistent nausea, vomiting, or diarrhea.  You have a bad smelling vaginal discharge.  You have pain when you urinate.  You notice increased swelling in your face, hands, legs, or ankles.  You are exposed to fifth disease or chickenpox.  You are exposed to German measles (rubella) and have never had it. Get help right away if:  You have a fever.  You are leaking fluid from your vagina.  You have spotting or bleeding from your vagina.  You have severe abdominal cramping or pain.  You have rapid weight gain or loss.  You vomit blood or material that looks like coffee grounds.  You develop a severe headache.  You have shortness of breath.  You have any kind of trauma, such as from a fall or a car accident. Summary  The first trimester of pregnancy is from week 1 until the end of week 13 (months 1 through 3).  Your body goes through many changes during pregnancy. The changes vary from  woman to woman.  You will have routine prenatal visits. During those visits, your health care provider will examine you, discuss any test results you may have, and talk with you about how you are feeling. This information is not intended to replace advice given to you by your health care provider. Make sure you discuss any questions you have with your health care provider. Document Released: 05/15/2001 Document Revised: 05/02/2016 Document Reviewed: 05/02/2016 [employer] Interactive Patient Education  2018 [employer]  

## 2018-03-19 ENCOUNTER — Ambulatory Visit (INDEPENDENT_AMBULATORY_CARE_PROVIDER_SITE_OTHER): Payer: Medicaid Other

## 2018-03-19 DIAGNOSIS — O3680X0 Pregnancy with inconclusive fetal viability, not applicable or unspecified: Secondary | ICD-10-CM | POA: Diagnosis not present

## 2018-03-19 NOTE — Progress Notes (Signed)
[place] 8 wks,single IUP w/ys,positive fht 167 bpm,normal ovaries bilat,subchorionic hemorrhage 4.2 x .8 x 2.2 cm,crl 15.57 mm

## 2018-04-02 ENCOUNTER — Ambulatory Visit (INDEPENDENT_AMBULATORY_CARE_PROVIDER_SITE_OTHER): Payer: Medicaid Other | Admitting: Advanced Practice Midwife

## 2018-04-02 ENCOUNTER — Ambulatory Visit: Payer: Medicaid Other | Admitting: *Deleted

## 2018-04-02 ENCOUNTER — Encounter: Payer: Self-pay | Admitting: Advanced Practice Midwife

## 2018-04-02 VITALS — BP 118/84 | HR 96 | Wt 156.0 lb

## 2018-04-02 DIAGNOSIS — Z1389 Encounter for screening for other disorder: Secondary | ICD-10-CM

## 2018-04-02 DIAGNOSIS — Z8759 Personal history of other complications of pregnancy, childbirth and the puerperium: Secondary | ICD-10-CM

## 2018-04-02 DIAGNOSIS — Z3A10 10 weeks gestation of pregnancy: Secondary | ICD-10-CM

## 2018-04-02 DIAGNOSIS — Z331 Pregnant state, incidental: Secondary | ICD-10-CM

## 2018-04-02 DIAGNOSIS — Z23 Encounter for immunization: Secondary | ICD-10-CM | POA: Diagnosis not present

## 2018-04-02 DIAGNOSIS — Z3481 Encounter for supervision of other normal pregnancy, first trimester: Secondary | ICD-10-CM | POA: Diagnosis not present

## 2018-04-02 DIAGNOSIS — Z3682 Encounter for antenatal screening for nuchal translucency: Secondary | ICD-10-CM

## 2018-04-02 DIAGNOSIS — Z3490 Encounter for supervision of normal pregnancy, unspecified, unspecified trimester: Secondary | ICD-10-CM | POA: Insufficient documentation

## 2018-04-02 LAB — POCT URINALYSIS DIPSTICK OB
Blood, UA: NEGATIVE
GLUCOSE, UA: NEGATIVE
Ketones, UA: NEGATIVE
Leukocytes, UA: NEGATIVE
Nitrite, UA: NEGATIVE
POC,PROTEIN,UA: NEGATIVE

## 2018-04-02 NOTE — Patient Instructions (Signed)
START TAKING 2 BABY ASPIRIN (162MG  TOTAL) AFTER 12 WEEKS     First Trimester of Pregnancy The first trimester of pregnancy is from week 1 until the end of week 12 (months 1 through 3). A week after a sperm fertilizes an egg, the egg will implant on the wall of the uterus. This embryo will begin to develop into a baby. Genes from you and your partner are forming the baby. The female genes determine whether the baby is a boy or a girl. At 6-8 weeks, the eyes and face are formed, and the heartbeat can be seen on ultrasound. At the end of 12 weeks, all the baby's organs are formed.  Now that you are pregnant, you will want to do everything you can to have a healthy baby. Two of the most important things are to get good prenatal care and to follow your health care provider's instructions. Prenatal care is all the medical care you receive before the baby's birth. This care will help prevent, find, and treat any problems during the pregnancy and childbirth. BODY CHANGES Your body goes through many changes during pregnancy. The changes vary from woman to woman.   You may gain or lose a couple of pounds at first.  You may feel sick to your stomach (nauseous) and throw up (vomit). If the vomiting is uncontrollable, call your health care provider.  You may tire easily.  You may develop headaches that can be relieved by medicines approved by your health care provider.  You may urinate more often. Painful urination may mean you have a bladder infection.  You may develop heartburn as a result of your pregnancy.  You may develop constipation because certain hormones are causing the muscles that push waste through your intestines to slow down.  You may develop hemorrhoids or swollen, bulging veins (varicose veins).  Your breasts may begin to grow larger and become tender. Your nipples may stick out more, and the tissue that surrounds them (areola) may become darker.  Your gums may bleed and may be  sensitive to brushing and flossing.  Dark spots or blotches (chloasma, mask of pregnancy) may develop on your face. This will likely fade after the baby is born.  Your menstrual periods will stop.  You may have a loss of appetite.  You may develop cravings for certain kinds of food.  You may have changes in your emotions from day to day, such as being excited to be pregnant or being concerned that something may go wrong with the pregnancy and baby.  You may have more vivid and strange dreams.  You may have changes in your hair. These can include thickening of your hair, rapid growth, and changes in texture. Some women also have hair loss during or after pregnancy, or hair that feels dry or thin. Your hair will most likely return to normal after your baby is born. WHAT TO EXPECT AT YOUR PRENATAL VISITS During a routine prenatal visit:  You will be weighed to make sure you and the baby are growing normally.  Your blood pressure will be taken.  Your abdomen will be measured to track your baby's growth.  The fetal heartbeat will be listened to starting around week 10 or 12 of your pregnancy.  Test results from any previous visits will be discussed. Your health care provider may ask you:  How you are feeling.  If you are feeling the baby move.  If you have had any abnormal symptoms, such as leaking fluid,  bleeding, severe headaches, or abdominal cramping.  If you have any questions. Other tests that may be performed during your first trimester include:  Blood tests to find your blood type and to check for the presence of any previous infections. They will also be used to check for low iron levels (anemia) and Rh antibodies. Later in the pregnancy, blood tests for diabetes will be done along with other tests if problems develop.  Urine tests to check for infections, diabetes, or protein in the urine.  An ultrasound to confirm the proper growth and development of the baby.  An  amniocentesis to check for possible genetic problems.  Fetal screens for spina bifida and Down syndrome.  You may need other tests to make sure you and the baby are doing well. HOME CARE INSTRUCTIONS  Medicines  Follow your health care provider's instructions regarding medicine use. Specific medicines may be either safe or unsafe to take during pregnancy.  Take your prenatal vitamins as directed.  If you develop constipation, try taking a stool softener if your health care provider approves. Diet  Eat regular, well-balanced meals. Choose a variety of foods, such as meat or vegetable-based protein, fish, milk and low-fat dairy products, vegetables, fruits, and whole grain breads and cereals. Your health care provider will help you determine the amount of weight gain that is right for you.  Avoid raw meat and uncooked cheese. These carry germs that can cause birth defects in the baby.  Eating four or five small meals rather than three large meals a day may help relieve nausea and vomiting. If you start to feel nauseous, eating a few soda crackers can be helpful. Drinking liquids between meals instead of during meals also seems to help nausea and vomiting.  If you develop constipation, eat more high-fiber foods, such as fresh vegetables or fruit and whole grains. Drink enough fluids to keep your urine clear or pale yellow. Activity and Exercise  Exercise only as directed by your health care provider. Exercising will help you:  Control your weight.  Stay in shape.  Be prepared for labor and delivery.  Experiencing pain or cramping in the lower abdomen or low back is a good sign that you should stop exercising. Check with your health care provider before continuing normal exercises.  Try to avoid standing for long periods of time. Move your legs often if you must stand in one place for a long time.  Avoid heavy lifting.  Wear low-heeled shoes, and practice good posture.  You may  continue to have sex unless your health care provider directs you otherwise. Relief of Pain or Discomfort  Wear a good support bra for breast tenderness.   Take warm sitz baths to soothe any pain or discomfort caused by hemorrhoids. Use hemorrhoid cream if your health care provider approves.   Rest with your legs elevated if you have leg cramps or low back pain.  If you develop varicose veins in your legs, wear support hose. Elevate your feet for 15 minutes, 3-4 times a day. Limit salt in your diet. Prenatal Care  Schedule your prenatal visits by the twelfth week of pregnancy. They are usually scheduled monthly at first, then more often in the last 2 months before delivery.  Write down your questions. Take them to your prenatal visits.  Keep all your prenatal visits as directed by your health care provider. Safety  Wear your seat belt at all times when driving.  Make a list of emergency phone numbers,  including numbers for family, friends, the hospital, and police and fire departments. General Tips  Ask your health care provider for a referral to a local prenatal education class. Begin classes no later than at the beginning of month 6 of your pregnancy.  Ask for help if you have counseling or nutritional needs during pregnancy. Your health care provider can offer advice or refer you to specialists for help with various needs.  Do not use hot tubs, steam rooms, or saunas.  Do not douche or use tampons or scented sanitary pads.  Do not cross your legs for long periods of time.  Avoid cat litter boxes and soil used by cats. These carry germs that can cause birth defects in the baby and possibly loss of the fetus by miscarriage or stillbirth.  Avoid all smoking, herbs, alcohol, and medicines not prescribed by your health care provider. Chemicals in these affect the formation and growth of the baby.  Schedule a dentist appointment. At home, brush your teeth with a soft toothbrush  and be gentle when you floss. SEEK MEDICAL CARE IF:   You have dizziness.  You have mild pelvic cramps, pelvic pressure, or nagging pain in the abdominal area.  You have persistent nausea, vomiting, or diarrhea.  You have a bad smelling vaginal discharge.  You have pain with urination.  You notice increased swelling in your face, hands, legs, or ankles. SEEK IMMEDIATE MEDICAL CARE IF:   You have a fever.  You are leaking fluid from your vagina.  You have spotting or bleeding from your vagina.  You have severe abdominal cramping or pain.  You have rapid weight gain or loss.  You vomit blood or material that looks like coffee grounds.  You are exposed to [place] measles and have never had them.  You are exposed to fifth disease or chickenpox.  You develop a severe headache.  You have shortness of breath.  You have any kind of trauma, such as from a fall or a car accident. Document Released: 05/15/2001 Document Revised: 10/05/2013 Document Reviewed: 03/31/2013 [facility] Patient Information 2015 [place], [place]. This information is not intended to replace advice given to you by your health care provider. Make sure you discuss any questions you have with your health care provider.   Nausea & Vomiting  Have saltine crackers or pretzels by your bed and eat a few bites before you raise your head out of bed in the morning  Eat small frequent meals throughout the day instead of large meals  Drink plenty of fluids throughout the day to stay hydrated, just don't drink a lot of fluids with your meals.  This can make your stomach fill up faster making you feel sick  Do not brush your teeth right after you eat  Products with real ginger are good for nausea, like ginger ale and ginger hard candy Make sure it says made with real ginger!  Sucking on sour candy like lemon heads is also good for nausea  If your prenatal vitamins make you nauseated, take them at night so you will  sleep through the nausea  Sea Bands  If you feel like you need medicine for the nausea & vomiting please let us know  If you are unable to keep any fluids or food down please let us know   Constipation  Drink plenty of fluid, preferably water, throughout the day  Eat foods high in fiber such as fruits, vegetables, and grains  Exercise, such as walking, is a  good way to keep your bowels regular  Drink warm fluids, especially warm prune juice, or decaf coffee  Eat a 1/2 cup of real oatmeal (not instant), 1/2 cup applesauce, and 1/2-1 cup warm prune juice every day  If needed, you may take Colace (docusate sodium) stool softener once or twice a day to help keep the stool soft. If you are pregnant, wait until you are out of your first trimester (12-14 weeks of pregnancy)  If you still are having problems with constipation, you may take Miralax once daily as needed to help keep your bowels regular.  If you are pregnant, wait until you are out of your first trimester (12-14 weeks of pregnancy)  Safe Medications in Pregnancy   Acne: Benzoyl Peroxide Salicylic Acid  Backache/Headache: Tylenol: 2 regular strength every 4 hours OR              2 Extra strength every 6 hours  Colds/Coughs/Allergies: Benadryl (alcohol free) 25 mg every 6 hours as needed Breath right strips Claritin Cepacol throat lozenges Chloraseptic throat spray Cold-Eeze- up to three times per day Cough drops, alcohol free Flonase (by prescription only) Guaifenesin Mucinex Robitussin DM (plain only, alcohol free) Saline nasal spray/drops Sudafed (pseudoephedrine) & Actifed ** use only after [redacted] weeks gestation and if you do not have high blood pressure Tylenol Vicks Vaporub Zinc lozenges Zyrtec   Constipation: Colace Ducolax suppositories Fleet enema Glycerin suppositories Metamucil Milk of magnesia Miralax Senokot Smooth move tea  Diarrhea: Kaopectate Imodium A-D  *NO pepto  Bismol  Hemorrhoids: Anusol Anusol HC Preparation H Tucks  Indigestion: Tums Maalox Mylanta Zantac  Pepcid  Insomnia: Benadryl (alcohol free) 25mg  every 6 hours as needed Tylenol PM Unisom, no Gelcaps  Leg Cramps: Tums MagGel  Nausea/Vomiting:  Bonine Dramamine Emetrol Ginger extract Sea bands Meclizine  Nausea medication to take during pregnancy:  Unisom (doxylamine succinate 25 mg tablets) Take one tablet daily at bedtime. If symptoms are not adequately controlled, the dose can be increased to a maximum recommended dose of two tablets daily (1/2 tablet in the morning, 1/2 tablet mid-afternoon and one at bedtime). Vitamin B6 100mg  tablets. Take one tablet twice a day (up to 200 mg per day).  Skin Rashes: Aveeno products Benadryl cream or 25mg  every 6 hours as needed Calamine Lotion 1% cortisone cream  Yeast infection: Gyne-lotrimin 7 Monistat 7   **If taking multiple medications, please check labels to avoid duplicating the same active ingredients **take medication as directed on the label ** Do not exceed 4000 mg of tylenol in 24 hours **Do not take medications that contain aspirin or ibuprofen

## 2018-04-02 NOTE — Progress Notes (Signed)
INITIAL OBSTETRICAL VISIT Patient name: Alicia Small MRN [id number]  Date of birth: 09-Feb-1991 Chief Complaint:   Initial Prenatal Visit  History of Present Illness:   Alicia Small is a 27 y.o. G45P1001 Caucasian female at [redacted]w[redacted]d by LMO with an Estimated Date of Delivery: 10/29/18 being seen today for her initial obstetrical visit.   Her obstetrical history is significant for pre-eclampsia. And IUGR  Today she reports no complaints.  Patient's last menstrual period was 01/22/2018. Last pap 2017. Results were: normal Review of Systems:   Pertinent items are noted in HPI Denies cramping/contractions, leakage of fluid, vaginal bleeding, abnormal vaginal discharge w/ itching/odor/irritation, headaches, visual changes, shortness of breath, chest pain, abdominal pain, severe nausea/vomiting, or problems with urination or bowel movements unless otherwise stated above.  Pertinent History Reviewed:  Reviewed past medical,surgical, social, obstetrical and family history.  Reviewed problem list, medications and allergies. OB History  Gravida Para Term Preterm AB Living  2 1 1     1   SAB TAB Ectopic Multiple Live Births        0 1    # Outcome Date GA Lbr Len/2nd Weight Sex Delivery Anes PTL Lv  2 Current           1 Term 06/12/16 [redacted]w[redacted]d 08:11 / 01:08 4 lb 10 oz (2.098 kg) F Vag-Spont EPI  LIV   Physical Assessment:   Vitals:   04/02/18 1000  BP: 118/84  Pulse: 96  Weight: 156 lb (70.8 kg)  Body mass index is 26.78 kg/m.       Physical Examination:  General appearance - well appearing, and in no distress  Mental status - alert, oriented to person, place, and time  Psych:  She has a normal mood and affect  Skin - warm and dry, normal color, no suspicious lesions noted  Chest - effort normal, all lung fields clear to auscultation bilaterally  Heart - normal rate and regular rhythm  Abdomen - soft, nontender  Extremities:  No swelling or varicosities noted    Results for orders  placed or performed in visit on 04/02/18 (from the past 24 hour(s))  POC Urinalysis Dipstick OB   Collection Time: 04/02/18 10:13 AM  Result Value Ref Range   Color, UA     Clarity, UA     Glucose, UA Negative Negative   Bilirubin, UA     Ketones, UA neg    Spec Grav, UA     Blood, UA neg    pH, UA     POC Protein UA Negative Negative, Trace   Urobilinogen, UA     Nitrite, UA neg    Leukocytes, UA Negative Negative   Appearance     Odor      Assessment & Plan:  1) Low-Risk Pregnancy G2P1001 at [redacted]w[redacted]d with an Estimated Date of Delivery: 10/29/18   2) Initial OB visit  3) hx IUGR/GHTN  Meds: No orders of the defined types were placed in this encounter.   Initial labs obtained Continue prenatal vitamins Reviewed n/v relief measures and warning s/s to report Reviewed recommended weight gain based on pre-gravid BMI Encouraged well-balanced diet Genetic Screening discussed Integrated Screen: requested Cystic fibrosis screening discussed neg 2017 Ultrasound discussed; fetal survey: requested CCNC completed  Follow-up: Return in about 3 weeks (around 04/23/2018) for US:NT+1st IT, LROB.   Orders Placed This Encounter  Procedures  . GC/Chlamydia Probe Amp  . Urine Culture  . Tdap vaccine greater than or equal to 7yo IM  .  Urinalysis, Routine w reflex microscopic  . Obstetric Panel, Including HIV  . Pain Management Screening Profile (10S)  . POC Urinalysis Dipstick OB     Pt received Tdap instead of flu shot (got flu shot when error was discovered), so will not need tdap in third trimester  Jacklyn Shell DNP, CNM 04/02/2018 10:44 AM

## 2018-04-03 LAB — URINALYSIS, ROUTINE W REFLEX MICROSCOPIC
BILIRUBIN UA: NEGATIVE
Glucose, UA: NEGATIVE
KETONES UA: NEGATIVE
Nitrite, UA: NEGATIVE
PH UA: 7.5 (ref 5.0–7.5)
RBC UA: NEGATIVE
SPEC GRAV UA: 1.026 (ref 1.005–1.030)
UUROB: 0.2 mg/dL (ref 0.2–1.0)

## 2018-04-03 LAB — MICROSCOPIC EXAMINATION: Casts: NONE SEEN /lpf

## 2018-04-03 LAB — PMP SCREEN PROFILE (10S), URINE
AMPHETAMINE SCREEN URINE: NEGATIVE ng/mL
BARBITURATE SCREEN URINE: NEGATIVE ng/mL
BENZODIAZEPINE SCREEN, URINE: NEGATIVE ng/mL
CANNABINOIDS UR QL SCN: POSITIVE ng/mL — AB
Cocaine (Metab) Scrn, Ur: NEGATIVE ng/mL
Creatinine(Crt), U: 179.2 mg/dL (ref 20.0–300.0)
METHADONE SCREEN, URINE: NEGATIVE ng/mL
OXYCODONE+OXYMORPHONE UR QL SCN: NEGATIVE ng/mL
Opiate Scrn, Ur: NEGATIVE ng/mL
PH UR, DRUG SCRN: 7.8 (ref 4.5–8.9)
PHENCYCLIDINE QUANTITATIVE URINE: NEGATIVE ng/mL
PROPOXYPHENE SCREEN URINE: NEGATIVE ng/mL

## 2018-04-03 LAB — OBSTETRIC PANEL, INCLUDING HIV
Antibody Screen: NEGATIVE
BASOS ABS: 0.1 10*3/uL (ref 0.0–0.2)
BASOS: 1 %
EOS (ABSOLUTE): 0.1 10*3/uL (ref 0.0–0.4)
Eos: 1 %
HEMATOCRIT: 42.9 % (ref 34.0–46.6)
HEP B S AG: NEGATIVE
HIV Screen 4th Generation wRfx: NONREACTIVE
Hemoglobin: 13.9 g/dL (ref 11.1–15.9)
IMMATURE GRANS (ABS): 0 10*3/uL (ref 0.0–0.1)
Immature Granulocytes: 0 %
LYMPHS: 24 %
Lymphocytes Absolute: 2.5 10*3/uL (ref 0.7–3.1)
MCH: 28.4 pg (ref 26.6–33.0)
MCHC: 32.4 g/dL (ref 31.5–35.7)
MCV: 88 fL (ref 79–97)
MONOCYTES: 6 %
Monocytes Absolute: 0.7 10*3/uL (ref 0.1–0.9)
Neutrophils Absolute: 7.4 10*3/uL — ABNORMAL HIGH (ref 1.4–7.0)
Neutrophils: 68 %
PLATELETS: 295 10*3/uL (ref 150–450)
RBC: 4.9 x10E6/uL (ref 3.77–5.28)
RDW: 12.7 % (ref 12.3–15.4)
RPR: NONREACTIVE
RUBELLA: 1.03 {index} (ref >0.99)
Rh Factor: POSITIVE
WBC: 10.8 10*3/uL (ref 3.4–10.8)

## 2018-04-03 LAB — GC/CHLAMYDIA PROBE AMP
Chlamydia trachomatis, NAA: NEGATIVE
Neisseria gonorrhoeae by PCR: NEGATIVE

## 2018-04-04 LAB — URINE CULTURE

## 2018-04-23 ENCOUNTER — Ambulatory Visit (INDEPENDENT_AMBULATORY_CARE_PROVIDER_SITE_OTHER): Payer: Medicaid Other

## 2018-04-23 ENCOUNTER — Ambulatory Visit (INDEPENDENT_AMBULATORY_CARE_PROVIDER_SITE_OTHER): Payer: Medicaid Other | Admitting: Advanced Practice Midwife

## 2018-04-23 ENCOUNTER — Encounter: Payer: Self-pay | Admitting: Advanced Practice Midwife

## 2018-04-23 VITALS — BP 121/83 | HR 88 | Wt 161.0 lb

## 2018-04-23 DIAGNOSIS — Z363 Encounter for antenatal screening for malformations: Secondary | ICD-10-CM

## 2018-04-23 DIAGNOSIS — Z331 Pregnant state, incidental: Secondary | ICD-10-CM

## 2018-04-23 DIAGNOSIS — Z1389 Encounter for screening for other disorder: Secondary | ICD-10-CM

## 2018-04-23 DIAGNOSIS — Z3402 Encounter for supervision of normal first pregnancy, second trimester: Secondary | ICD-10-CM

## 2018-04-23 DIAGNOSIS — Z3682 Encounter for antenatal screening for nuchal translucency: Secondary | ICD-10-CM

## 2018-04-23 DIAGNOSIS — Z3A13 13 weeks gestation of pregnancy: Secondary | ICD-10-CM

## 2018-04-23 DIAGNOSIS — Z3481 Encounter for supervision of other normal pregnancy, first trimester: Secondary | ICD-10-CM

## 2018-04-23 LAB — POCT URINALYSIS DIPSTICK OB
Glucose, UA: NEGATIVE
Ketones, UA: NEGATIVE
Nitrite, UA: NEGATIVE
POC,PROTEIN,UA: NEGATIVE
RBC UA: NEGATIVE

## 2018-04-23 NOTE — Progress Notes (Signed)
US 13 wks,measurements c/w dates,normal ovaries bilat,posterior placenta,subchorionic hemorrhage 3.2 x 1.6 x 1.5 cm,fhr 155 bpm,crl 67.34 mm,NB present,NT 1.9 mm

## 2018-04-23 NOTE — Patient Instructions (Signed)
Alicia Small, I greatly value your feedback.  If you receive a survey following your visit with us today, we appreciate you taking the time to fill it out.  Thanks, Alicia Small, CNM     Second Trimester of Pregnancy The second trimester is from week 14 through week 27 (months 4 through 6). The second trimester is often a time when you feel your best. Your body has adjusted to being pregnant, and you begin to feel better physically. Usually, morning sickness has lessened or quit completely, you may have more energy, and you may have an increase in appetite. The second trimester is also a time when the fetus is growing rapidly. At the end of the sixth month, the fetus is about 9 inches long and weighs about 1 pounds. You will likely begin to feel the baby move (quickening) between 16 and 20 weeks of pregnancy. Body changes during your second trimester Your body continues to go through many changes during your second trimester. The changes vary from woman to woman.  Your weight will continue to increase. You will notice your lower abdomen bulging out.  You may begin to get stretch marks on your hips, abdomen, and breasts.  You may develop headaches that can be relieved by medicines. The medicines should be approved by your health care provider.  You may urinate more often because the fetus is pressing on your bladder.  You may develop or continue to have heartburn as a result of your pregnancy.  You may develop constipation because certain hormones are causing the muscles that push waste through your intestines to slow down.  You may develop hemorrhoids or swollen, bulging veins (varicose veins).  You may have back pain. This is caused by: ? Weight gain. ? Pregnancy hormones that are relaxing the joints in your pelvis. ? A shift in weight and the muscles that support your balance.  Your breasts will continue to grow and they will continue to become tender.  Your gums may  bleed and may be sensitive to brushing and flossing.  Dark spots or blotches (chloasma, mask of pregnancy) may develop on your face. This will likely fade after the baby is born.  A dark line from your belly button to the pubic area (linea nigra) may appear. This will likely fade after the baby is born.  You may have changes in your hair. These can include thickening of your hair, rapid growth, and changes in texture. Some women also have hair loss during or after pregnancy, or hair that feels dry or thin. Your hair will most likely return to normal after your baby is born.  What to expect at prenatal visits During a routine prenatal visit:  You will be weighed to make sure you and the fetus are growing normally.  Your blood pressure will be taken.  Your abdomen will be measured to track your baby's growth.  The fetal heartbeat will be listened to.  Any test results from the previous visit will be discussed.  Your health care provider may ask you:  How you are feeling.  If you are feeling the baby move.  If you have had any abnormal symptoms, such as leaking fluid, bleeding, severe headaches, or abdominal cramping.  If you are using any tobacco products, including cigarettes, chewing tobacco, and electronic cigarettes.  If you have any questions.  Other tests that may be performed during your second trimester include:  Blood tests that check for: ? Low iron levels (anemia). ? High  blood sugar that affects pregnant women (gestational diabetes) between 42 and 28 weeks. ? Rh antibodies. This is to check for a protein on red blood cells (Rh factor).  Urine tests to check for infections, diabetes, or protein in the urine.  An ultrasound to confirm the proper growth and development of the baby.  An amniocentesis to check for possible genetic problems.  Fetal screens for spina bifida and Down syndrome.  HIV (human immunodeficiency virus) testing. Routine prenatal testing  includes screening for HIV, unless you choose not to have this test.  Follow these instructions at home: Medicines  Follow your health care provider's instructions regarding medicine use. Specific medicines may be either safe or unsafe to take during pregnancy.  Take a prenatal vitamin that contains at least 600 micrograms (mcg) of folic acid.  If you develop constipation, try taking a stool softener if your health care provider approves. Eating and drinking  Eat a balanced diet that includes fresh fruits and vegetables, whole grains, good sources of protein such as meat, eggs, or tofu, and low-fat dairy. Your health care provider will help you determine the amount of weight gain that is right for you.  Avoid raw meat and uncooked cheese. These carry germs that can cause birth defects in the baby.  If you have low calcium intake from food, talk to your health care provider about whether you should take a daily calcium supplement.  Limit foods that are high in fat and processed sugars, such as fried and sweet foods.  To prevent constipation: ? Drink enough fluid to keep your urine clear or pale yellow. ? Eat foods that are high in fiber, such as fresh fruits and vegetables, whole grains, and beans. Activity  Exercise only as directed by your health care provider. Most women can continue their usual exercise routine during pregnancy. Try to exercise for 30 minutes at least 5 days a week. Stop exercising if you experience uterine contractions.  Avoid heavy lifting, wear low heel shoes, and practice good posture.  A sexual relationship may be continued unless your health care provider directs you otherwise. Relieving pain and discomfort  Wear a good support bra to prevent discomfort from breast tenderness.  Take warm sitz baths to soothe any pain or discomfort caused by hemorrhoids. Use hemorrhoid cream if your health care provider approves.  Rest with your legs elevated if you have  leg cramps or low back pain.  If you develop varicose veins, wear support hose. Elevate your feet for 15 minutes, 3-4 times a day. Limit salt in your diet. Prenatal Care  Write down your questions. Take them to your prenatal visits.  Keep all your prenatal visits as told by your health care provider. This is important. Safety  Wear your seat belt at all times when driving.  Make a list of emergency phone numbers, including numbers for family, friends, the hospital, and police and fire departments. General instructions  Ask your health care provider for a referral to a local prenatal education class. Begin classes no later than the beginning of month 6 of your pregnancy.  Ask for help if you have counseling or nutritional needs during pregnancy. Your health care provider can offer advice or refer you to specialists for help with various needs.  Do not use hot tubs, steam rooms, or saunas.  Do not douche or use tampons or scented sanitary pads.  Do not cross your legs for long periods of time.  Avoid cat litter boxes and soil  used by cats. These carry germs that can cause birth defects in the baby and possibly loss of the fetus by miscarriage or stillbirth.  Avoid all smoking, herbs, alcohol, and unprescribed drugs. Chemicals in these products can affect the formation and growth of the baby.  Do not use any products that contain nicotine or tobacco, such as cigarettes and e-cigarettes. If you need help quitting, ask your health care provider.  Visit your dentist if you have not gone yet during your pregnancy. Use a soft toothbrush to brush your teeth and be gentle when you floss. Contact a health care provider if:  You have dizziness.  You have mild pelvic cramps, pelvic pressure, or nagging pain in the abdominal area.  You have persistent nausea, vomiting, or diarrhea.  You have a bad smelling vaginal discharge.  You have pain when you urinate. Get help right away if:  You  have a fever.  You are leaking fluid from your vagina.  You have spotting or bleeding from your vagina.  You have severe abdominal cramping or pain.  You have rapid weight gain or weight loss.  You have shortness of breath with chest pain.  You notice sudden or extreme swelling of your face, hands, ankles, feet, or legs.  You have not felt your baby move in over an hour.  You have severe headaches that do not go away when you take medicine.  You have vision changes. Summary  The second trimester is from week 14 through week 27 (months 4 through 6). It is also a time when the fetus is growing rapidly.  Your body goes through many changes during pregnancy. The changes vary from woman to woman.  Avoid all smoking, herbs, alcohol, and unprescribed drugs. These chemicals affect the formation and growth your baby.  Do not use any tobacco products, such as cigarettes, chewing tobacco, and e-cigarettes. If you need help quitting, ask your health care provider.  Contact your health care provider if you have any questions. Keep all prenatal visits as told by your health care provider. This is important. This information is not intended to replace advice given to you by your health care provider. Make sure you discuss any questions you have with your health care provider.      CHILDBIRTH CLASSES [phone] is the phone number for Pregnancy Classes or hospital tours at [facility] will be referred to  [email or web address] for more information on childbirth classes  At this site you may register for classes. You may sign up for a waiting list if classes are full. Please SIGN UP FOR THIS!.   When the waiting list becomes long, sometimes new classes can be added.

## 2018-04-23 NOTE — Progress Notes (Signed)
  G2P1001 6222w0d Estimated Date of Delivery: 10/29/18  Blood pressure 121/83, pulse 88, weight 161 lb (73 kg), last menstrual period 01/22/2018, not currently breastfeeding.   BP weight and urine results all reviewed and noted.  Please refer to the obstetrical flow sheet for the fundal height and fetal heart rate documentation:  Patient denies any bleeding and no rupture of membranes symptoms or regular contractions. Patient is without complaints. All questions were answered.   Physical Assessment:   Vitals:   04/23/18 1014  BP: 121/83  Pulse: 88  Weight: 161 lb (73 kg)  Body mass index is 27.64 kg/m.        Physical Examination:   General appearance: Well appearing, and in no distress  Mental status: Alert, oriented to person, place, and time  Skin: Warm & dry  Cardiovascular: Normal heart rate noted  Respiratory: Normal respiratory effort, no distress  Abdomen: Soft, gravid, nontender  Pelvic: Cervical exam deferred         Extremities: Edema: None  Fetal Status: Fetal Heart Rate (bpm): 155us       US 13 wks,measurements c/w dates,normal ovaries bilat,posterior placenta,subchorionic hemorrhage 3.2 x 1.6 x 1.5 cm,fhr 155 bpm,crl 67.34 mm,NB present,NT 1.9 mm  Results for orders placed or performed in visit on 04/23/18 (from the past 24 hour(s))  POC Urinalysis Dipstick OB   Collection Time: 04/23/18 10:11 AM  Result Value Ref Range   Color, UA     Clarity, UA     Glucose, UA Negative Negative   Bilirubin, UA     Ketones, UA neg    Spec Grav, UA     Blood, UA neg    pH, UA     POC,PROTEIN,UA Negative Negative, Trace, Small (1+), Moderate (2+), Large (3+), 4+   Urobilinogen, UA     Nitrite, UA neg    Leukocytes, UA Small (1+) (A) Negative   Appearance     Odor       Orders Placed This Encounter  Procedures  . US OB Comp + 14 Wk  . Integrated 1  . POC Urinalysis Dipstick OB    Plan:  Continued routine obstetrical care,   Return in about 5 weeks (around  05/28/2018) for LROB, ZO:[id number]:Anatomy.

## 2018-04-25 LAB — INTEGRATED 1
Crown Rump Length: 67.3 mm
Gest. Age on Collection Date: 12.9 weeks
MATERNAL AGE AT EDD: 28.3 a
NUCHAL TRANSLUCENCY (NT): 1.9 mm
NUMBER OF FETUSES: 1
PAPP-A VALUE: 448.6 ng/mL
Weight: 161 [lb_av]

## 2018-04-29 ENCOUNTER — Telehealth: Payer: Self-pay | Admitting: *Deleted

## 2018-04-29 NOTE — Telephone Encounter (Signed)
Patient states she has vaginal irritation, no discharge or odor just uncomfortable. Wants to be checked before we are closed for the holiday.  Will get in tomorrow.

## 2018-04-30 ENCOUNTER — Encounter: Payer: Self-pay | Admitting: Advanced Practice Midwife

## 2018-04-30 ENCOUNTER — Other Ambulatory Visit: Payer: Self-pay

## 2018-04-30 ENCOUNTER — Ambulatory Visit (INDEPENDENT_AMBULATORY_CARE_PROVIDER_SITE_OTHER): Payer: Medicaid Other | Admitting: Advanced Practice Midwife

## 2018-04-30 VITALS — BP 124/82 | HR 92 | Wt 161.0 lb

## 2018-04-30 DIAGNOSIS — Z3A14 14 weeks gestation of pregnancy: Secondary | ICD-10-CM

## 2018-04-30 DIAGNOSIS — B9689 Other specified bacterial agents as the cause of diseases classified elsewhere: Secondary | ICD-10-CM

## 2018-04-30 DIAGNOSIS — N760 Acute vaginitis: Secondary | ICD-10-CM | POA: Diagnosis not present

## 2018-04-30 DIAGNOSIS — Z1389 Encounter for screening for other disorder: Secondary | ICD-10-CM

## 2018-04-30 DIAGNOSIS — Z3482 Encounter for supervision of other normal pregnancy, second trimester: Secondary | ICD-10-CM

## 2018-04-30 DIAGNOSIS — Z331 Pregnant state, incidental: Secondary | ICD-10-CM

## 2018-04-30 LAB — POCT URINALYSIS DIPSTICK OB
Glucose, UA: NEGATIVE
Ketones, UA: NEGATIVE
NITRITE UA: NEGATIVE
RBC UA: NEGATIVE

## 2018-04-30 MED ORDER — METRONIDAZOLE 500 MG PO TABS: 500.0000 mg | ORAL_TABLET | Freq: Two times a day (BID) | ORAL | 0 refills | Status: DC

## 2018-04-30 NOTE — Progress Notes (Signed)
WORK IN FOR GREEN DISCHARGE    LOW-RISK PREGNANCY VISIT Patient name: Alicia Small MRN [id number]  Date of birth: [birth year] Chief Complaint:   vaginal irritation (green discharge)  History of Present Illness:   Alicia Small is a 27 y.o. G72P1001 female at [redacted]w[redacted]d with an Estimated Date of Delivery: 10/29/18 being seen today for ongoing management of a low-risk pregnancy.  Today she reports green discharge that comes and goes. It ususally responds to a pH balancing med, but comes back. Also has some irritaiton , esp w/intercourse. .  Lockie Pares. Bleeding: None.   . denies leaking of fluid. Review of Systems:   Pertinent items are noted in HPI Denies abnormal vaginal discharge w/ itching/odor/irritation, headaches, visual changes, shortness of breath, chest pain, abdominal pain, severe nausea/vomiting, or problems with urination or bowel movements unless otherwise stated above.  Pertinent History Reviewed:  Medical & Surgical Hx:   Past Medical History:  Diagnosis Date  . Abnormal vaginal Pap smear   . Pregnancy induced hypertension   . Vaginal Pap smear, abnormal    Past Surgical History:  Procedure Laterality Date  . NO PAST SURGERIES     Family History  Problem Relation Age of Onset  . Hypertension Mother   . Diabetes Maternal Grandfather   . Cancer Maternal Grandfather        lung  . Stroke Maternal Grandfather   . Other Paternal Grandfather        polyps  . Hypertension Father   . Autism Brother   . Asthma Maternal Grandmother   . Thyroid disease Maternal Grandmother     Current Outpatient Medications:  .  Prenatal Multivit-Min-Fe-FA (PRENATAL VITAMINS PO), Take by mouth daily., Disp: , Rfl:  .  metroNIDAZOLE (FLAGYL) 500 MG tablet, Take 1 tablet (500 mg total) by mouth 2 (two) times daily., Disp: 14 tablet, Rfl: 0 Social History: Reviewed -  reports that she has never smoked. She has never used smokeless tobacco.  Physical Assessment:   Vitals:   04/30/18 1027  BP:  124/82  Pulse: 92  Weight: 161 lb (73 kg)  Body mass index is 27.64 kg/m.        Physical Examination:   General appearance: Well appearing, and in no distress  Mental status: Alert, oriented to person, place, and time  Skin: Warm & dry  Cardiovascular: Normal heart rate noted  Respiratory: Normal respiratory effort, no distress  Abdomen: Soft, gravid, nontender  Pelvic: scant amoutn of white dc. Only a few clues, no trich or yeast         Extremities: Edema: None  Fetal Status: Fetal Heart Rate (bpm): 152        Results for orders placed or performed in visit on 04/30/18 (from the past 24 hour(s))  POC Urinalysis Dipstick OB   Collection Time: 04/30/18 10:27 AM  Result Value Ref Range   Color, UA     Clarity, UA     Glucose, UA Negative Negative   Bilirubin, UA     Ketones, UA neg    Spec Grav, UA     Blood, UA neg    pH, UA     POC,PROTEIN,UA Small (1+) Negative, Trace, Small (1+), Moderate (2+), Large (3+), 4+   Urobilinogen, UA     Nitrite, UA neg    Leukocytes, UA Moderate (2+) (A) Negative   Appearance     Odor      Assessment & Plan:  1) Low-risk pregnancy G2P1001 at [redacted]w[redacted]d with an  Estimated Date of Delivery: 10/29/18   2) ? BV, d/t intermittent sx relieved temporarily by pH balancer, will try a course of flagyl   Labs/procedures/US today: none  Plan:  Continue routine obstetrical care    Follow-up: Return for As scheduled.  Orders Placed This Encounter  Procedures  . POC Urinalysis Dipstick OB   Jacklyn ShellFrances Cresenzo-Dishmon CNM 04/30/2018 10:56 AM

## 2018-06-02 ENCOUNTER — Ambulatory Visit (INDEPENDENT_AMBULATORY_CARE_PROVIDER_SITE_OTHER): Payer: Medicaid Other | Admitting: Obstetrics & Gynecology

## 2018-06-02 ENCOUNTER — Encounter: Payer: Self-pay | Admitting: Obstetrics & Gynecology

## 2018-06-02 ENCOUNTER — Ambulatory Visit (INDEPENDENT_AMBULATORY_CARE_PROVIDER_SITE_OTHER): Payer: Medicaid Other

## 2018-06-02 VITALS — BP 128/86 | HR 101 | Wt 171.0 lb

## 2018-06-02 DIAGNOSIS — Z331 Pregnant state, incidental: Secondary | ICD-10-CM

## 2018-06-02 DIAGNOSIS — Z363 Encounter for antenatal screening for malformations: Secondary | ICD-10-CM

## 2018-06-02 DIAGNOSIS — Z1389 Encounter for screening for other disorder: Secondary | ICD-10-CM

## 2018-06-02 DIAGNOSIS — Z3482 Encounter for supervision of other normal pregnancy, second trimester: Secondary | ICD-10-CM

## 2018-06-02 DIAGNOSIS — Z3A18 18 weeks gestation of pregnancy: Secondary | ICD-10-CM

## 2018-06-02 LAB — POCT URINALYSIS DIPSTICK OB
Glucose, UA: NEGATIVE
KETONES UA: NEGATIVE
NITRITE UA: NEGATIVE

## 2018-06-02 NOTE — Progress Notes (Signed)
   LOW-RISK PREGNANCY VISIT Patient name: Alicia Small MRN [id number]  Date of birth: 21-Aug-1990 Chief Complaint:   Routine Prenatal Visit  History of Present Illness:   Alicia Small is a 27 y.o. 402P1001 female at 3561w5d with an Estimated Date of Delivery: 10/29/18 being seen today for ongoing management of a low-risk pregnancy.  Today she reports no complaints. Contractions: Not present. Vag. Bleeding: None.   . denies leaking of fluid. Review of Systems:   Pertinent items are noted in HPI Denies abnormal vaginal discharge w/ itching/odor/irritation, headaches, visual changes, shortness of breath, chest pain, abdominal pain, severe nausea/vomiting, or problems with urination or bowel movements unless otherwise stated above. Pertinent History Reviewed:  Reviewed past medical,surgical, social, obstetrical and family history.  Reviewed problem list, medications and allergies. Physical Assessment:   Vitals:   06/02/18 1137  BP: 128/86  Pulse: (!) 101  Weight: 171 lb (77.6 kg)  Body mass index is 29.35 kg/m.        Physical Examination:   General appearance: Well appearing, and in no distress  Mental status: Alert, oriented to person, place, and time  Skin: Warm & dry  Cardiovascular: Normal heart rate noted  Respiratory: Normal respiratory effort, no distress  Abdomen: Soft, gravid, nontender  Pelvic: Cervical exam deferred         Extremities: Edema: None  Fetal Status: Fetal Heart Rate (bpm): 146        Results for orders placed or performed in visit on 06/02/18 (from the past 24 hour(s))  POC Urinalysis Dipstick OB   Collection Time: 06/02/18 11:39 AM  Result Value Ref Range   Color, UA     Clarity, UA     Glucose, UA Negative Negative   Bilirubin, UA     Ketones, UA neg    Spec Grav, UA     Blood, UA small    pH, UA     POC,PROTEIN,UA Trace Negative, Trace, Small (1+), Moderate (2+), Large (3+), 4+   Urobilinogen, UA     Nitrite, UA neg    Leukocytes, UA Small  (1+) (A) Negative   Appearance     Odor      Assessment & Plan:  1) Low-risk pregnancy G2P1001 at 6861w5d with an Estimated Date of Delivery: 10/29/18   2) Hx of GHTN   Meds: No orders of the defined types were placed in this encounter.  Labs/procedures today:   Plan:  Continue routine obstetrical care   Reviewed:  labor symptoms and general obstetric precautions including but not limited to vaginal bleeding, contractions, leaking of fluid and fetal movement were reviewed in detail with the patient.  All questions were answered  Follow-up: Return in about 4 weeks (around 06/30/2018) for LROB.  Orders Placed This Encounter  Procedures  . POC Urinalysis Dipstick OB   Amaryllis DykeLuther H Dove Gresham  06/02/2018 12:02 PM

## 2018-06-02 NOTE — Progress Notes (Signed)
US 18+5 wks,cephalic,fundal placenta gr 0,normal ovaries bilat,fhr 146 bpm,svp of fluid 5 cm,cx 4.2 cm,efw 251 g 41%,anatomy complete,no obvious abnormalities

## 2018-06-04 NOTE — L&D Delivery Note (Signed)
Delivery Note At 2242 a viable female infant was delivered via SVD, presentation: OA. APGAR: 7, 9; weight pending.   Placenta status: spontaneously delivered, intact via Tomasa Blase. Cord: 3 vessel. Cord ph n/a. Complications none.  Anesthesia: epidural Lacerations: 2nd degree perineal Suture used for repair: 2-0 Vicryl Est. Blood Loss (mL): 50  Mom to postpartum.  Baby to Couplet care / Skin to Skin.   Donette Larry, CNM 10/13/2018 11:10 PM

## 2018-06-30 ENCOUNTER — Encounter: Payer: Medicaid Other | Admitting: Women's Health

## 2018-07-07 ENCOUNTER — Encounter: Payer: Medicaid Other | Admitting: Women's Health

## 2018-07-21 ENCOUNTER — Ambulatory Visit (INDEPENDENT_AMBULATORY_CARE_PROVIDER_SITE_OTHER): Payer: Medicaid Other | Admitting: Women's Health

## 2018-07-21 ENCOUNTER — Encounter: Payer: Self-pay | Admitting: Women's Health

## 2018-07-21 VITALS — BP 123/85 | HR 98 | Wt 186.2 lb

## 2018-07-21 DIAGNOSIS — Z3482 Encounter for supervision of other normal pregnancy, second trimester: Secondary | ICD-10-CM

## 2018-07-21 DIAGNOSIS — Z331 Pregnant state, incidental: Secondary | ICD-10-CM

## 2018-07-21 DIAGNOSIS — Z1389 Encounter for screening for other disorder: Secondary | ICD-10-CM

## 2018-07-21 DIAGNOSIS — Z8759 Personal history of other complications of pregnancy, childbirth and the puerperium: Secondary | ICD-10-CM

## 2018-07-21 DIAGNOSIS — Z3A25 25 weeks gestation of pregnancy: Secondary | ICD-10-CM

## 2018-07-21 LAB — POCT URINALYSIS DIPSTICK OB
Glucose, UA: NEGATIVE
Ketones, UA: NEGATIVE
LEUKOCYTES UA: NEGATIVE
Nitrite, UA: NEGATIVE
RBC UA: NEGATIVE

## 2018-07-21 NOTE — Progress Notes (Signed)
   LOW-RISK PREGNANCY VISIT Patient name: Alicia Small MRN [id number]  Date of birth: 12-Aug-1990 Chief Complaint:   Routine Prenatal Visit  History of Present Illness:   Alicia Small is a 28 y.o. G81P1001 female at [redacted]w[redacted]d with an Estimated Date of Delivery: 10/29/18 being seen today for ongoing management of a low-risk pregnancy.  Today she reports no complaints. Contractions: Not present. Vag. Bleeding: None.  Movement: Present. denies leaking of fluid. Review of Systems:   Pertinent items are noted in HPI Denies abnormal vaginal discharge w/ itching/odor/irritation, headaches, visual changes, shortness of breath, chest pain, abdominal pain, severe nausea/vomiting, or problems with urination or bowel movements unless otherwise stated above. Pertinent History Reviewed:  Reviewed past medical,surgical, social, obstetrical and family history.  Reviewed problem list, medications and allergies. Physical Assessment:   Vitals:   07/21/18 1431  BP: 123/85  Pulse: 98  Weight: 186 lb 3.2 oz (84.5 kg)  Body mass index is 31.96 kg/m.        Physical Examination:   General appearance: Well appearing, and in no distress  Mental status: Alert, oriented to person, place, and time  Skin: Warm & dry  Cardiovascular: Normal heart rate noted  Respiratory: Normal respiratory effort, no distress  Abdomen: Soft, gravid, nontender  Pelvic: Cervical exam deferred         Extremities: Edema: Trace  Fetal Status: Fetal Heart Rate (bpm): 143 Fundal Height: 25 cm Movement: Present    Results for orders placed or performed in visit on 07/21/18 (from the past 24 hour(s))  POC Urinalysis Dipstick OB   Collection Time: 07/21/18  2:33 PM  Result Value Ref Range   Color, UA     Clarity, UA     Glucose, UA Negative Negative   Bilirubin, UA     Ketones, UA neg    Spec Grav, UA     Blood, UA neg    pH, UA     POC,PROTEIN,UA Trace Negative, Trace, Small (1+), Moderate (2+), Large (3+), 4+   Urobilinogen, UA     Nitrite, UA neg    Leukocytes, UA Negative Negative   Appearance     Odor      Assessment & Plan:  1) Low-risk pregnancy G2P1001 at [redacted]w[redacted]d with an Estimated Date of Delivery: 10/29/18   2) H/O IUGR, efw @ 28, 32, 36wks  3) Excessive weight gain> 15lbs since last visit, 36lbs overall, decrease carbs, increase activity  4) H/O GHTN> ASA   Meds: No orders of the defined types were placed in this encounter.  Labs/procedures today: none  Plan:  Continue routine obstetrical care   Reviewed: Preterm labor symptoms and general obstetric precautions including but not limited to vaginal bleeding, contractions, leaking of fluid and fetal movement were reviewed in detail with the patient.  All questions were answered  Follow-up: Return in about 3 weeks (around 08/11/2018) for LROB, PN2, US:EFW.  Orders Placed This Encounter  Procedures  . US OB Follow Up  . POC Urinalysis Dipstick OB   Cheral Marker CNM, [facility] 07/21/2018 3:08 PM

## 2018-07-21 NOTE — Patient Instructions (Addendum)
Alicia Small, I greatly value your feedback.  If you receive a survey following your visit with [place] today, we appreciate you taking the time to fill it out.  Thanks, Joellyn Haff, CNM, WHNP-BC   You will have your sugar test next visit.  Please do not eat or drink anything after midnight the night before you come, not even water.  You will be here for at least two hours.     Call the office [phone]) or go to [facility] if:  You begin to have strong, frequent contractions  Your water breaks.  Sometimes it is a big gush of fluid, sometimes it is just a trickle that keeps getting your panties wet or running down your legs  You have vaginal bleeding.  It is normal to have a small amount of spotting if your cervix was checked.   You don't feel your baby moving like normal.  If you don't, get you something to eat and drink and lay down and focus on feeling your baby move.   If your baby is still not moving like normal, you should call the office or go to [facility].  For Headaches:   Stay well hydrated, drink enough water so that your urine is clear, sometimes if you are dehydrated you can get headaches  Eat small frequent meals and snacks, sometimes if you are hungry you can get headaches  Sometimes you get headaches during pregnancy from the pregnancy hormones  You can try tylenol (1-2 regular strength 325mg  or 1-2 extra strength 500mg ) as directed on the box. The least amount of medication that works is best.   Cool compresses (cool wet washcloth or ice pack) to area of head that is hurting  You can also try drinking a caffeinated drink to see if this will help  If not helping, try below:  For Prevention of Headaches/Migraines:  CoQ10 100mg  three times daily  Vitamin B2 400mg  daily  Magnesium Oxide 400-600mg  daily  If You Get a Bad Headache/Migraine:  Benadryl 25mg    Magnesium Oxide  1 large Gatorade  2 extra strength Tylenol (1,000mg  total)  1 cup coffee  or Coke  If this doesn't help please call us @ [phone]    Second Trimester of Pregnancy The second trimester is from week 13 through week 28, months 4 through 6. The second trimester is often a time when you feel your best. Your body has also adjusted to being pregnant, and you begin to feel better physically. Usually, morning sickness has lessened or quit completely, you may have more energy, and you may have an increase in appetite. The second trimester is also a time when the fetus is growing rapidly. At the end of the sixth month, the fetus is about 9 inches long and weighs about 1 pounds. You will likely begin to feel the baby move (quickening) between 18 and 20 weeks of the pregnancy. BODY CHANGES Your body goes through many changes during pregnancy. The changes vary from woman to woman.   Your weight will continue to increase. You will notice your lower abdomen bulging out.  You may begin to get stretch marks on your hips, abdomen, and breasts.  You may develop headaches that can be relieved by medicines approved by your health care provider.  You may urinate more often because the fetus is pressing on your bladder.  You may develop or continue to have heartburn as a result of your pregnancy.  You may develop constipation because certain hormones  are causing the muscles that push waste through your intestines to slow down.  You may develop hemorrhoids or swollen, bulging veins (varicose veins).  You may have back pain because of the weight gain and pregnancy hormones relaxing your joints between the bones in your pelvis and as a result of a shift in weight and the muscles that support your balance.  Your breasts will continue to grow and be tender.  Your gums may bleed and may be sensitive to brushing and flossing.  Dark spots or blotches (chloasma, mask of pregnancy) may develop on your face. This will likely fade after the baby is born.  A dark line from your belly  button to the pubic area (linea nigra) may appear. This will likely fade after the baby is born.  You may have changes in your hair. These can include thickening of your hair, rapid growth, and changes in texture. Some women also have hair loss during or after pregnancy, or hair that feels dry or thin. Your hair will most likely return to normal after your baby is born. WHAT TO EXPECT AT YOUR PRENATAL VISITS During a routine prenatal visit:  You will be weighed to make sure you and the fetus are growing normally.  Your blood pressure will be taken.  Your abdomen will be measured to track your baby's growth.  The fetal heartbeat will be listened to.  Any test results from the previous visit will be discussed. Your health care provider may ask you:  How you are feeling.  If you are feeling the baby move.  If you have had any abnormal symptoms, such as leaking fluid, bleeding, severe headaches, or abdominal cramping.  If you have any questions. Other tests that may be performed during your second trimester include:  Blood tests that check for:  Low iron levels (anemia).  Gestational diabetes (between 24 and 28 weeks).  Rh antibodies.  Urine tests to check for infections, diabetes, or protein in the urine.  An ultrasound to confirm the proper growth and development of the baby.  An amniocentesis to check for possible genetic problems.  Fetal screens for spina bifida and Down syndrome. HOME CARE INSTRUCTIONS   Avoid all smoking, herbs, alcohol, and unprescribed drugs. These chemicals affect the formation and growth of the baby.  Follow your health care provider's instructions regarding medicine use. There are medicines that are either safe or unsafe to take during pregnancy.  Exercise only as directed by your health care provider. Experiencing uterine cramps is a good sign to stop exercising.  Continue to eat regular, healthy meals.  Wear a good support bra for breast  tenderness.  Do not use hot tubs, steam rooms, or saunas.  Wear your seat belt at all times when driving.  Avoid raw meat, uncooked cheese, cat litter boxes, and soil used by cats. These carry germs that can cause birth defects in the baby.  Take your prenatal vitamins.  Try taking a stool softener (if your health care provider approves) if you develop constipation. Eat more high-fiber foods, such as fresh vegetables or fruit and whole grains. Drink plenty of fluids to keep your urine clear or pale yellow.  Take warm sitz baths to soothe any pain or discomfort caused by hemorrhoids. Use hemorrhoid cream if your health care provider approves.  If you develop varicose veins, wear support hose. Elevate your feet for 15 minutes, 3-4 times a day. Limit salt in your diet.  Avoid heavy lifting, wear low heel shoes,  and practice good posture.  Rest with your legs elevated if you have leg cramps or low back pain.  Visit your dentist if you have not gone yet during your pregnancy. Use a soft toothbrush to brush your teeth and be gentle when you floss.  A sexual relationship may be continued unless your health care provider directs you otherwise.  Continue to go to all your prenatal visits as directed by your health care provider. SEEK MEDICAL CARE IF:   You have dizziness.  You have mild pelvic cramps, pelvic pressure, or nagging pain in the abdominal area.  You have persistent nausea, vomiting, or diarrhea.  You have a bad smelling vaginal discharge.  You have pain with urination. SEEK IMMEDIATE MEDICAL CARE IF:   You have a fever.  You are leaking fluid from your vagina.  You have spotting or bleeding from your vagina.  You have severe abdominal cramping or pain.  You have rapid weight gain or loss.  You have shortness of breath with chest pain.  You notice sudden or extreme swelling of your face, hands, ankles, feet, or legs.  You have not felt your baby move in over an  hour.  You have severe headaches that do not go away with medicine.  You have vision changes. Document Released: 05/15/2001 Document Revised: 05/26/2013 Document Reviewed: 07/22/2012 [facility] Patient Information 2015 [place]. This information is not intended to replace advice given to you by your health care provider. Make sure you discuss any questions you have with your health care provider.

## 2018-08-06 NOTE — Telephone Encounter (Signed)
Note sent to nurse. 

## 2018-08-11 ENCOUNTER — Ambulatory Visit (INDEPENDENT_AMBULATORY_CARE_PROVIDER_SITE_OTHER): Payer: Medicaid Other | Admitting: Advanced Practice Midwife

## 2018-08-11 ENCOUNTER — Encounter: Payer: Self-pay | Admitting: Advanced Practice Midwife

## 2018-08-11 ENCOUNTER — Other Ambulatory Visit: Payer: Self-pay

## 2018-08-11 ENCOUNTER — Other Ambulatory Visit: Payer: Medicaid Other

## 2018-08-11 ENCOUNTER — Ambulatory Visit (INDEPENDENT_AMBULATORY_CARE_PROVIDER_SITE_OTHER): Payer: Medicaid Other

## 2018-08-11 VITALS — BP 132/86 | HR 103 | Wt 190.0 lb

## 2018-08-11 DIAGNOSIS — Z3483 Encounter for supervision of other normal pregnancy, third trimester: Secondary | ICD-10-CM | POA: Diagnosis not present

## 2018-08-11 DIAGNOSIS — Z8759 Personal history of other complications of pregnancy, childbirth and the puerperium: Secondary | ICD-10-CM

## 2018-08-11 DIAGNOSIS — O09293 Supervision of pregnancy with other poor reproductive or obstetric history, third trimester: Secondary | ICD-10-CM

## 2018-08-11 DIAGNOSIS — Z3A28 28 weeks gestation of pregnancy: Secondary | ICD-10-CM

## 2018-08-11 DIAGNOSIS — Z331 Pregnant state, incidental: Secondary | ICD-10-CM

## 2018-08-11 DIAGNOSIS — Z1389 Encounter for screening for other disorder: Secondary | ICD-10-CM

## 2018-08-11 DIAGNOSIS — Z3482 Encounter for supervision of other normal pregnancy, second trimester: Secondary | ICD-10-CM

## 2018-08-11 LAB — POCT URINALYSIS DIPSTICK OB
Blood, UA: NEGATIVE
Ketones, UA: NEGATIVE
Leukocytes, UA: NEGATIVE
Nitrite, UA: NEGATIVE

## 2018-08-11 MED ORDER — OMEPRAZOLE 20 MG PO CPDR: 20.0000 mg | DELAYED_RELEASE_CAPSULE | Freq: Every day | ORAL | 1 refills | Status: DC

## 2018-08-11 NOTE — Progress Notes (Signed)
  G2P1001 [redacted]w[redacted]d Estimated Date of Delivery: 10/29/18  Blood pressure 132/86, pulse (!) 103, weight 190 lb (86.2 kg), last menstrual period 01/22/2018.   BP weight and urine results all reviewed and noted.  Please refer to the obstetrical flow sheet for the fundal height and fetal heart rate documentation:  Patient reports good fetal movement, denies any bleeding and no rupture of membranes symptoms or regular contractions. Patient has reflux/indigestion  All questions were answered.   Physical Assessment:   Vitals:   08/11/18 1025  BP: 132/86  Pulse: (!) 103  Weight: 190 lb (86.2 kg)  Body mass index is 32.61 kg/m.        Physical Examination:   General appearance: Well appearing, and in no distress  Mental status: Alert, oriented to person, place, and time  Skin: Warm & dry  Cardiovascular: Normal heart rate noted  Respiratory: Normal respiratory effort, no distress  Abdomen: Soft, gravid, nontender  Pelvic: Cervical exam deferred         Extremities: Edema: Trace  Fetal Status: Fetal Heart Rate (bpm): 133us   Movement: Present   [place] 28+5 wks,cephalic,cx 3 cm,posterior placenta gr 0,normal ovaries bilat,fhr 133 bpm,AFI 11 cm,EFW 1344 g 53%  Results for orders placed or performed in visit on 08/11/18 (from the past 24 hour(s))  POC Urinalysis Dipstick OB   Collection Time: 08/11/18 10:24 AM  Result Value Ref Range   Color, UA     Clarity, UA     Glucose, UA Trace (A) Negative   Bilirubin, UA     Ketones, UA neg    Spec Grav, UA     Blood, UA neg    pH, UA     POC,PROTEIN,UA Small (1+) Negative, Trace, Small (1+), Moderate (2+), Large (3+), 4+   Urobilinogen, UA     Nitrite, UA neg    Leukocytes, UA Negative Negative   Appearance     Odor       Orders Placed This Encounter  Procedures  . US OB Follow Up  . POC Urinalysis Dipstick OB    Plan:  Continued routine obstetrical care, rx prilosec  Return in about 3 weeks (around 09/01/2018) for LROB, US:EFW.

## 2018-08-11 NOTE — Patient Instructions (Signed)
Alicia Small, I greatly value your feedback.  If you receive a survey following your visit with [place] today, we appreciate you taking the time to fill it out.  Thanks, Cathie Beams, CNM   Call the office [phone]) or go to [facility] if:  You begin to have strong, frequent contractions  Your water breaks.  Sometimes it is a big gush of fluid, sometimes it is just a trickle that keeps getting your panties wet or running down your legs  You have vaginal bleeding.  It is normal to have a small amount of spotting if your cervix was checked.   You don't feel your baby moving like normal.  If you don't, get you something to eat and drink and lay down and focus on feeling your baby move.  You should feel at least 10 movements in 2 hours.  If you don't, you should call the office or go to [facility].    Tdap Vaccine  It is recommended that you get the Tdap vaccine during the third trimester of EACH pregnancy to help protect your baby from getting pertussis (whooping cough)  27-36 weeks is the BEST time to do this so that you can pass the protection on to your baby. During pregnancy is better than after pregnancy, but if you are unable to get it during pregnancy it will be offered at the hospital.   You will be offered this vaccine in the office after 27 weeks. If you do not have health insurance, you can get this vaccine at the health department or your family doctor  Everyone who will be around your baby should also be up-to-date on their vaccines. Adults (who are not pregnant) only need 1 dose of Tdap during adulthood.   Third Trimester of Pregnancy The third trimester is from week 29 through week 42, months 7 through 9. The third trimester is a time when the fetus is growing rapidly. At the end of the ninth month, the fetus is about 20 inches in length and weighs 6-10 pounds.  BODY CHANGES Your body goes through many changes during pregnancy. The changes vary from woman to  woman.   Your weight will continue to increase. You can expect to gain 25-35 pounds (11-16 kg) by the end of the pregnancy.  You may begin to get stretch marks on your hips, abdomen, and breasts.  You may urinate more often because the fetus is moving lower into your pelvis and pressing on your bladder.  You may develop or continue to have heartburn as a result of your pregnancy.  You may develop constipation because certain hormones are causing the muscles that push waste through your intestines to slow down.  You may develop hemorrhoids or swollen, bulging veins (varicose veins).  You may have pelvic pain because of the weight gain and pregnancy hormones relaxing your joints between the bones in your pelvis. Backaches may result from overexertion of the muscles supporting your posture.  You may have changes in your hair. These can include thickening of your hair, rapid growth, and changes in texture. Some women also have hair loss during or after pregnancy, or hair that feels dry or thin. Your hair will most likely return to normal after your baby is born.  Your breasts will continue to grow and be tender. A yellow discharge may leak from your breasts called colostrum.  Your belly button may stick out.  You may feel short of breath because of your expanding uterus.  You  may notice the fetus "dropping," or moving lower in your abdomen.  You may have a bloody mucus discharge. This usually occurs a few days to a week before labor begins.  Your cervix becomes thin and soft (effaced) near your due date. WHAT TO EXPECT AT YOUR PRENATAL EXAMS  You will have prenatal exams every 2 weeks until week 36. Then, you will have weekly prenatal exams. During a routine prenatal visit:  You will be weighed to make sure you and the fetus are growing normally.  Your blood pressure is taken.  Your abdomen will be measured to track your baby's growth.  The fetal heartbeat will be listened  to.  Any test results from the previous visit will be discussed.  You may have a cervical check near your due date to see if you have effaced. At around 36 weeks, your caregiver will check your cervix. At the same time, your caregiver will also perform a test on the secretions of the vaginal tissue. This test is to determine if a type of bacteria, Group B streptococcus, is present. Your caregiver will explain this further. Your caregiver may ask you:  What your birth plan is.  How you are feeling.  If you are feeling the baby move.  If you have had any abnormal symptoms, such as leaking fluid, bleeding, severe headaches, or abdominal cramping.  If you have any questions. Other tests or screenings that may be performed during your third trimester include:  Blood tests that check for low iron levels (anemia).  Fetal testing to check the health, activity level, and growth of the fetus. Testing is done if you have certain medical conditions or if there are problems during the pregnancy. FALSE LABOR You may feel small, irregular contractions that eventually go away. These are called Braxton Hicks contractions, or false labor. Contractions may last for hours, days, or even weeks before true labor sets in. If contractions come at regular intervals, intensify, or become painful, it is best to be seen by your caregiver.  SIGNS OF LABOR   Menstrual-like cramps.  Contractions that are 5 minutes apart or less.  Contractions that start on the top of the uterus and spread down to the lower abdomen and back.  A sense of increased pelvic pressure or back pain.  A watery or bloody mucus discharge that comes from the vagina. If you have any of these signs before the 37th week of pregnancy, call your caregiver right away. You need to go to the hospital to get checked immediately. HOME CARE INSTRUCTIONS   Avoid all smoking, herbs, alcohol, and unprescribed drugs. These chemicals affect the  formation and growth of the baby.  Follow your caregiver's instructions regarding medicine use. There are medicines that are either safe or unsafe to take during pregnancy.  Exercise only as directed by your caregiver. Experiencing uterine cramps is a good sign to stop exercising.  Continue to eat regular, healthy meals.  Wear a good support bra for breast tenderness.  Do not use hot tubs, steam rooms, or saunas.  Wear your seat belt at all times when driving.  Avoid raw meat, uncooked cheese, cat litter boxes, and soil used by cats. These carry germs that can cause birth defects in the baby.  Take your prenatal vitamins.  Try taking a stool softener (if your caregiver approves) if you develop constipation. Eat more high-fiber foods, such as fresh vegetables or fruit and whole grains. Drink plenty of fluids to keep your urine clear  or pale yellow.  Take warm sitz baths to soothe any pain or discomfort caused by hemorrhoids. Use hemorrhoid cream if your caregiver approves.  If you develop varicose veins, wear support hose. Elevate your feet for 15 minutes, 3-4 times a day. Limit salt in your diet.  Avoid heavy lifting, wear low heal shoes, and practice good posture.  Rest a lot with your legs elevated if you have leg cramps or low back pain.  Visit your dentist if you have not gone during your pregnancy. Use a soft toothbrush to brush your teeth and be gentle when you floss.  A sexual relationship may be continued unless your caregiver directs you otherwise.  Do not travel far distances unless it is absolutely necessary and only with the approval of your caregiver.  Take prenatal classes to understand, practice, and ask questions about the labor and delivery.  Make a trial run to the hospital.  Pack your hospital bag.  Prepare the baby's nursery.  Continue to go to all your prenatal visits as directed by your caregiver. SEEK MEDICAL CARE IF:  You are unsure if you are in  labor or if your water has broken.  You have dizziness.  You have mild pelvic cramps, pelvic pressure, or nagging pain in your abdominal area.  You have persistent nausea, vomiting, or diarrhea.  You have a bad smelling vaginal discharge.  You have pain with urination. SEEK IMMEDIATE MEDICAL CARE IF:   You have a fever.  You are leaking fluid from your vagina.  You have spotting or bleeding from your vagina.  You have severe abdominal cramping or pain.  You have rapid weight loss or gain.  You have shortness of breath with chest pain.  You notice sudden or extreme swelling of your face, hands, ankles, feet, or legs.  You have not felt your baby move in over an hour.  You have severe headaches that do not go away with medicine.  You have vision changes. Document Released: 05/15/2001 Document Revised: 05/26/2013 Document Reviewed: 07/22/2012 [facility] Patient Information 2015 [place], [place]. This information is not intended to replace advice given to you by your health care provider. Make sure you discuss any questions you have with your health care provider.

## 2018-08-11 NOTE — Progress Notes (Signed)
[place] 28+5 wks,cephalic,cx 3 cm,posterior placenta gr 0,normal ovaries bilat,fhr 133 bpm,AFI 11 cm,EFW 1344 g 53%

## 2018-08-12 LAB — CBC
Hematocrit: 37.2 % (ref 34.0–46.6)
Hemoglobin: 12.7 g/dL (ref 11.1–15.9)
MCH: 30.5 pg (ref 26.6–33.0)
MCHC: 34.1 g/dL (ref 31.5–35.7)
MCV: 89 fL (ref 79–97)
Platelets: 338 10*3/uL (ref 150–450)
RBC: 4.17 x10E6/uL (ref 3.77–5.28)
RDW: 12 % (ref 11.7–15.4)
WBC: 12.5 10*3/uL — ABNORMAL HIGH (ref 3.4–10.8)

## 2018-08-12 LAB — GLUCOSE TOLERANCE, 2 HOURS W/ 1HR
Glucose, 1 hour: 169 mg/dL (ref 65–179)
Glucose, 2 hour: 95 mg/dL (ref 65–152)
Glucose, Fasting: 87 mg/dL (ref 65–91)

## 2018-08-12 LAB — RPR: RPR: NONREACTIVE

## 2018-08-12 LAB — HIV ANTIBODY (ROUTINE TESTING W REFLEX): HIV Screen 4th Generation wRfx: NONREACTIVE

## 2018-08-12 LAB — ANTIBODY SCREEN: Antibody Screen: NEGATIVE

## 2018-09-01 ENCOUNTER — Other Ambulatory Visit: Payer: Self-pay

## 2018-09-01 ENCOUNTER — Ambulatory Visit (INDEPENDENT_AMBULATORY_CARE_PROVIDER_SITE_OTHER): Payer: Medicaid Other | Admitting: Obstetrics & Gynecology

## 2018-09-01 ENCOUNTER — Ambulatory Visit (INDEPENDENT_AMBULATORY_CARE_PROVIDER_SITE_OTHER): Payer: Medicaid Other

## 2018-09-01 VITALS — BP 130/84 | HR 93 | Wt 194.0 lb

## 2018-09-01 DIAGNOSIS — Z3A31 31 weeks gestation of pregnancy: Secondary | ICD-10-CM

## 2018-09-01 DIAGNOSIS — Z3403 Encounter for supervision of normal first pregnancy, third trimester: Secondary | ICD-10-CM

## 2018-09-01 DIAGNOSIS — Z3483 Encounter for supervision of other normal pregnancy, third trimester: Secondary | ICD-10-CM

## 2018-09-01 DIAGNOSIS — Z8759 Personal history of other complications of pregnancy, childbirth and the puerperium: Secondary | ICD-10-CM | POA: Diagnosis not present

## 2018-09-01 DIAGNOSIS — Z362 Encounter for other antenatal screening follow-up: Secondary | ICD-10-CM

## 2018-09-01 DIAGNOSIS — Z3482 Encounter for supervision of other normal pregnancy, second trimester: Secondary | ICD-10-CM

## 2018-09-01 DIAGNOSIS — Z331 Pregnant state, incidental: Secondary | ICD-10-CM

## 2018-09-01 DIAGNOSIS — Z1389 Encounter for screening for other disorder: Secondary | ICD-10-CM

## 2018-09-01 LAB — POCT URINALYSIS DIPSTICK OB
Blood, UA: NEGATIVE
Glucose, UA: NEGATIVE
KETONES UA: NEGATIVE
Leukocytes, UA: NEGATIVE
Nitrite, UA: NEGATIVE

## 2018-09-01 NOTE — Progress Notes (Signed)
   LOW-RISK PREGNANCY VISIT Patient name: Alicia Small MRN [id number]  Date of birth: [birth year] Chief Complaint:   Routine Prenatal Visit ([place] today)  History of Present Illness:   Alicia Small is a 28 y.o. G31P1001 female at [redacted]w[redacted]d with an Estimated Date of Delivery: 10/29/18 being seen today for ongoing management of a low-risk pregnancy.  Today she reports no complaints. Contractions: Not present. Vag. Bleeding: None.  Movement: Present. denies leaking of fluid. Review of Systems:   Pertinent items are noted in HPI Denies abnormal vaginal discharge w/ itching/odor/irritation, headaches, visual changes, shortness of breath, chest pain, abdominal pain, severe nausea/vomiting, or problems with urination or bowel movements unless otherwise stated above. Pertinent History Reviewed:  Reviewed past medical,surgical, social, obstetrical and family history.  Reviewed problem list, medications and allergies. Physical Assessment:   Vitals:   09/01/18 1209  BP: 130/84  Pulse: 93  Weight: 194 lb (88 kg)  Body mass index is 33.3 kg/m.        Physical Examination:   General appearance: Well appearing, and in no distress  Mental status: Alert, oriented to person, place, and time  Skin: Warm & dry  Cardiovascular: Normal heart rate noted  Respiratory: Normal respiratory effort, no distress  Abdomen: Soft, gravid, nontender  Pelvic: Cervical exam deferred         Extremities: Edema: Trace  Fetal Status: Fetal Heart Rate (bpm): 154 Fundal Height: 31 cm Movement: Present    Results for orders placed or performed in visit on 09/01/18 (from the past 24 hour(s))  POC Urinalysis Dipstick OB   Collection Time: 09/01/18 12:09 PM  Result Value Ref Range   Color, UA     Clarity, UA     Glucose, UA Negative Negative   Bilirubin, UA     Ketones, UA neg    Spec Grav, UA     Blood, UA neg    pH, UA     POC,PROTEIN,UA Small (1+) Negative, Trace, Small (1+), Moderate (2+), Large (3+), 4+   Urobilinogen, UA     Nitrite, UA neg    Leukocytes, UA Negative Negative   Appearance     Odor      Assessment & Plan:  1) Low-risk pregnancy G2P1001 at [redacted]w[redacted]d with an Estimated Date of Delivery: 10/29/18   2) sonogram is normal growth   Meds: No orders of the defined types were placed in this encounter.  Labs/procedures today:   Plan:  Continue routine obstetrical care   Reviewed: Preterm labor symptoms and general obstetric precautions including but not limited to vaginal bleeding, contractions, leaking of fluid and fetal movement were reviewed in detail with the patient.  All questions were answered  Follow-up: Return for 2 weeks televisit, 4 weeks in office visit.  Pt can take her BP  Orders Placed This Encounter  Procedures  . POC Urinalysis Dipstick OB   Lazaro Arms  09/01/2018 12:19 PM

## 2018-09-01 NOTE — Progress Notes (Signed)
[place] 31+5 wks,cephalic,cx 3 cm,posterior placenta gr 2,afi 10.5 cm,normal ovaries bilat,fhr 154 bpm,efw 1772 g 31%

## 2018-09-11 ENCOUNTER — Encounter: Payer: Self-pay | Admitting: *Deleted

## 2018-09-15 ENCOUNTER — Other Ambulatory Visit: Payer: Self-pay

## 2018-09-15 ENCOUNTER — Ambulatory Visit (INDEPENDENT_AMBULATORY_CARE_PROVIDER_SITE_OTHER): Payer: Medicaid Other | Admitting: Women's Health

## 2018-09-15 ENCOUNTER — Encounter: Payer: Self-pay | Admitting: Women's Health

## 2018-09-15 VITALS — BP 120/84 | HR 117 | Wt 197.0 lb

## 2018-09-15 DIAGNOSIS — Z3483 Encounter for supervision of other normal pregnancy, third trimester: Secondary | ICD-10-CM

## 2018-09-15 DIAGNOSIS — Z8759 Personal history of other complications of pregnancy, childbirth and the puerperium: Secondary | ICD-10-CM

## 2018-09-15 DIAGNOSIS — Z3A33 33 weeks gestation of pregnancy: Secondary | ICD-10-CM

## 2018-09-15 NOTE — Progress Notes (Addendum)
   TELEHEALTH VIRTUAL OBSTETRICS VISIT ENCOUNTER NOTE  I connected with Alicia Small on 09/15/18 at  9:15 AM EDT by [facility] at home and verified that I am speaking with the correct person using two identifiers.   I discussed the limitations, risks, security and privacy concerns of performing an evaluation and management service by telephone and the availability of in person appointments. I also discussed with the patient that there may be a patient responsible charge related to this service. The patient expressed understanding and agreed to proceed.  Subjective:  Alicia Small is a 28 y.o. G2P1001 at [redacted]w[redacted]d being followed for ongoing prenatal care.  She is currently monitored for the following issues for this low-risk pregnancy and has Abnormal Pap smear of cervix; Marijuana use; History of prior pregnancy with IUGR newborn; History of gestational hypertension; and Supervision of normal pregnancy on their problem list.  Patient reports no complaints. Reports fetal movement. Denies any contractions, bleeding or leaking of fluid.   The following portions of the patient's history were reviewed and updated as appropriate: allergies, current medications, past family history, past medical history, past social history, past surgical history and problem list.   Objective:   General:  Alert, oriented and cooperative.   Mental Status: Normal mood and affect perceived. Normal judgment and thought content.  Rest of physical exam deferred due to type of encounter BP 120/84   Pulse (!) 117   Wt 197 lb (89.4 kg)   LMP 01/22/2018   BMI 33.81 kg/m   Assessment and Plan:  Pregnancy: G2P1001 at [redacted]w[redacted]d  H/O GHTN> continue baby ASA, check bp weekly, let us know if >140/90, reviewed pre-e s/s  H/O IUGR> EFW @ 36wks  Preterm labor symptoms and general obstetric precautions including but not limited to vaginal bleeding, contractions, leaking of fluid and fetal movement were reviewed in detail with the  patient.  I discussed the assessment and treatment plan with the patient. The patient was provided an opportunity to ask questions and all were answered. The patient agreed with the plan and demonstrated an understanding of the instructions. The patient was advised to call back or seek an in-person office evaluation/go to MAU at [facility] for any urgent or concerning symptoms. Please refer to After Visit Summary for other counseling recommendations.   I provided 7 minutes of non-face-to-face time during this encounter.  Return for 4/29 for efw u/s and LROB, gbs.  Future Appointments  Date Time Provider Department Center  10/01/2018 10:30 AM [facility] [place] [facility] None  10/01/2018 11:00 AM Cresenzo-Dishmon, Scarlette Calico, CNM [facility]    Cheral Marker, CNM Center for [employer], [facility]

## 2018-09-25 ENCOUNTER — Telehealth: Payer: Self-pay | Admitting: *Deleted

## 2018-09-25 ENCOUNTER — Telehealth: Payer: Self-pay | Admitting: Obstetrics & Gynecology

## 2018-09-25 MED ORDER — PROMETHAZINE HCL 25 MG PO TABS: 12.5000 mg | ORAL_TABLET | Freq: Four times a day (QID) | ORAL | 0 refills | Status: DC | PRN

## 2018-09-25 NOTE — Addendum Note (Signed)
Addended by: Cheral Marker on: 09/25/2018 12:59 PM   Modules accepted: Orders

## 2018-09-25 NOTE — Telephone Encounter (Signed)
Patient called, stated that she's 35 weeks and for the last 5-6 days she has been cramping and not able to eat for the last few days.  She is wanting to come in.  [phone]

## 2018-09-25 NOTE — Telephone Encounter (Signed)
Patient called with c/o nausea and vomiting for the past couple of days.  She is starting to have some cramps and is unsure if it is associated with the vomiting.  Says she is still urinating a good amount.  She is requesting nausea medication.

## 2018-09-29 ENCOUNTER — Encounter: Payer: Medicaid Other | Admitting: Women's Health

## 2018-09-30 ENCOUNTER — Encounter: Payer: Self-pay | Admitting: *Deleted

## 2018-10-01 ENCOUNTER — Encounter: Payer: Self-pay | Admitting: Women's Health

## 2018-10-01 ENCOUNTER — Ambulatory Visit (INDEPENDENT_AMBULATORY_CARE_PROVIDER_SITE_OTHER): Payer: Medicaid Other | Admitting: Women's Health

## 2018-10-01 ENCOUNTER — Other Ambulatory Visit: Payer: Self-pay

## 2018-10-01 ENCOUNTER — Ambulatory Visit (INDEPENDENT_AMBULATORY_CARE_PROVIDER_SITE_OTHER): Payer: Medicaid Other

## 2018-10-01 VITALS — BP 124/87 | HR 94 | Temp 98.4°F | Wt 200.0 lb

## 2018-10-01 DIAGNOSIS — Z3483 Encounter for supervision of other normal pregnancy, third trimester: Secondary | ICD-10-CM | POA: Diagnosis not present

## 2018-10-01 DIAGNOSIS — Z3A36 36 weeks gestation of pregnancy: Secondary | ICD-10-CM | POA: Diagnosis not present

## 2018-10-01 DIAGNOSIS — Z331 Pregnant state, incidental: Secondary | ICD-10-CM

## 2018-10-01 DIAGNOSIS — O09293 Supervision of pregnancy with other poor reproductive or obstetric history, third trimester: Secondary | ICD-10-CM

## 2018-10-01 DIAGNOSIS — R82998 Other abnormal findings in urine: Secondary | ICD-10-CM

## 2018-10-01 DIAGNOSIS — Z8759 Personal history of other complications of pregnancy, childbirth and the puerperium: Secondary | ICD-10-CM

## 2018-10-01 DIAGNOSIS — O1213 Gestational proteinuria, third trimester: Secondary | ICD-10-CM | POA: Diagnosis not present

## 2018-10-01 DIAGNOSIS — Z1389 Encounter for screening for other disorder: Secondary | ICD-10-CM

## 2018-10-01 LAB — POCT URINALYSIS DIPSTICK OB
Blood, UA: NEGATIVE
Glucose, UA: NEGATIVE
Ketones, UA: NEGATIVE
Nitrite, UA: NEGATIVE

## 2018-10-01 NOTE — Progress Notes (Signed)
   LOW-RISK PREGNANCY VISIT Patient name: Alicia Small MRN [id number]  Date of birth: 10-03-1990 Chief Complaint:   Routine Prenatal Visit ([place] today;GBS, GC/CHL; + cramping)  History of Present Illness:   Alicia Small is a 28 y.o. G63P1001 female at [redacted]w[redacted]d with an Estimated Date of Delivery: 10/29/18 being seen today for ongoing management of a low-risk pregnancy.  Today she reports some cramping. Contractions: Not present. Vag. Bleeding: None.  Movement: Present. denies leaking of fluid. Review of Systems:   Pertinent items are noted in HPI Denies abnormal vaginal discharge w/ itching/odor/irritation, headaches, visual changes, shortness of breath, chest pain, abdominal pain, severe nausea/vomiting, or problems with urination or bowel movements unless otherwise stated above. Pertinent History Reviewed:  Reviewed past medical,surgical, social, obstetrical and family history.  Reviewed problem list, medications and allergies. Physical Assessment:   Vitals:   10/01/18 1127  BP: 124/87  Pulse: 94  Temp: 98.4 F (36.9 C)  Weight: 200 lb (90.7 kg)  Body mass index is 34.33 kg/m.        Physical Examination:   General appearance: Well appearing, and in no distress  Mental status: Alert, oriented to person, place, and time  Skin: Warm & dry  Cardiovascular: Normal heart rate noted  Respiratory: Normal respiratory effort, no distress  Abdomen: Soft, gravid, nontender  Pelvic: Cervical exam performed  Dilation: 1 Effacement (%): Thick Station: -2  Extremities: Edema: Trace  Fetal Status: Fetal Heart Rate (bpm): 140 u/s   Movement: Present Presentation: Vertex Today's EFW [place] for h/o IUGR: 36 wks,cephalic,posterior placenta gr 3,afi 13 cm,fhr 140 bpm,efw 2716 g 40% Results for orders placed or performed in visit on 10/01/18 (from the past 24 hour(s))  POC Urinalysis Dipstick OB   Collection Time: 10/01/18 11:28 AM  Result Value Ref Range   Color, UA     Clarity, UA     Glucose, UA  Negative Negative   Bilirubin, UA     Ketones, UA neg    Spec Grav, UA     Blood, UA neg    pH, UA     POC,PROTEIN,UA Trace Negative, Trace, Small (1+), Moderate (2+), Large (3+), 4+   Urobilinogen, UA     Nitrite, UA neg    Leukocytes, UA Moderate (2+) (A) Negative   Appearance     Odor      Assessment & Plan:  1) Low-risk pregnancy G2P1001 at [redacted]w[redacted]d with an Estimated Date of Delivery: 10/29/18   2) H/O IUGR, EFW 40% today  3) H/O GHTN> continue ASA, check bp daily, if >140/90 let us know, reviewed pre-e s/s, reasons to seek care   Meds: No orders of the defined types were placed in this encounter.  Labs/procedures today: gbs, gc/ct, efw u/s  Plan:  Continue routine obstetrical care   Reviewed: Preterm labor symptoms and general obstetric precautions including but not limited to vaginal bleeding, contractions, leaking of fluid and fetal movement were reviewed in detail with the patient.  All questions were answered  Follow-up: Return in about 1 week (around 10/08/2018) for LROB webex.  Orders Placed This Encounter  Procedures  . Strep Gp B NAA+Rflx  . GC/Chlamydia Probe Amp  . Urine Culture  . POC Urinalysis Dipstick OB   Cheral Marker CNM, [facility] 10/01/2018 11:50 AM

## 2018-10-01 NOTE — Patient Instructions (Signed)
Nikole Paschal, I greatly value your feedback.  If you receive a survey following your visit with [place] today, we appreciate you taking the time tLucretia Kernt.  Thanks, Joellyn Haff, CNM, [facility]  [facility] HAS MOVED!!! It is now [facility] ([address] [place], [place] [address]) Entrance located off of [address] Free 24/7 valet parking    Call the office [phone]) or go to [facility] if:  You begin to have strong, frequent contractions  Your water breaks.  Sometimes it is a big gush of fluid, sometimes it is just a trickle that keeps getting your panties wet or running down your legs  You have vaginal bleeding.  It is normal to have a small amount of spotting if your cervix was checked.   You don't feel your baby moving like normal.  If you don't, get you something to eat and drink and lay down and focus on feeling your baby move.  You should feel at least 10 movements in 2 hours.  If you don't, you should call the office or go to [facility].    Call the office [phone]) or go to [facility] hospital for these signs of pre-eclampsia:  Severe headache that does not go away with Tylenol  Visual changes- seeing spots, double, blurred vision  Pain under your right breast or upper abdomen that does not go away with Tums or heartburn medicine  Nausea and/or vomiting  Severe swelling in your hands, feet, and face     Home Blood Pressure Monitoring for Patients   Your provider has recommended that you check your blood pressure (BP) at least once a week at home. If you do not have a blood pressure cuff at home, one will be provided for you. Contact your provider if you have not received your monitor within 1 week.   Helpful Tips for Accurate Home Blood Pressure Checks  . Don't smoke, exercise, or drink caffeine 30 minutes before checking your BP . Use the restroom before checking your BP (a full bladder can raise your pressure) . Relax  in a comfortable upright chair . Feet on the ground . Left arm resting comfortably on a flat surface at the level of your heart . Legs uncrossed . Back supported . Sit quietly and don't talk . Place the cuff on your bare arm . Adjust snuggly, so that only two fingertips can fit between your skin and the top of the cuff . Check 2 readings separated by at least one minute . Keep a log of your BP readings . For a visual, please reference this diagram: http://ccnc.care/bpdiagram  Provider Name: [facility]     Phone: [phone]  Zone 1: ALL CLEAR  Continue to monitor your symptoms:  . BP reading is less than 140 (top number) or less than 90 (bottom number)  . No right upper stomach pain . No headaches or seeing spots . No feeling nauseated or throwing up . No swelling in face and hands  Zone 2: CAUTION Call your doctor's office for any of the following:  . BP reading is greater than 140 (top number) or greater than 90 (bottom number)  . Stomach pain under your ribs in the middle or right side . Headaches or seeing spots . Feeling nauseated or throwing up . Swelling in face and hands  Zone 3: EMERGENCY  Seek immediate medical care if you have any of the following:  . BP  reading is greater than160 (top number) or greater than 110 (bottom number) . Severe headaches not improving with Tylenol . Serious difficulty catching your breath . Any worsening symptoms from Zone 2    Preterm Labor and Birth Information  The normal length of a pregnancy is 39-41 weeks. Preterm labor is when labor starts before 37 completed weeks of pregnancy. What are the risk factors for preterm labor? Preterm labor is more likely to occur in women who:  Have certain infections during pregnancy such as a bladder infection, sexually transmitted infection, or infection inside the uterus (chorioamnionitis).  Have a shorter-than-normal cervix.  Have gone into preterm labor before.  Have had  surgery on their cervix.  Are younger than age 28 or older than age 28.  Are African American.  Are pregnant with twins or multiple babies (multiple gestation).  Take street drugs or smoke while pregnant.  Do not gain enough weight while pregnant.  Became pregnant shortly after having been pregnant. What are the symptoms of preterm labor? Symptoms of preterm labor include:  Cramps similar to those that can happen during a menstrual period. The cramps may happen with diarrhea.  Pain in the abdomen or lower back.  Regular uterine contractions that may feel like tightening of the abdomen.  A feeling of increased pressure in the pelvis.  Increased watery or bloody mucus discharge from the vagina.  Water breaking (ruptured amniotic sac). Why is it important to recognize signs of preterm labor? It is important to recognize signs of preterm labor because babies who are born prematurely may not be fully developed. This can put them at an increased risk for:  Long-term (chronic) heart and lung problems.  Difficulty immediately after birth with regulating body systems, including blood sugar, body temperature, heart rate, and breathing rate.  Bleeding in the brain.  Cerebral palsy.  Learning difficulties.  Death. These risks are highest for babies who are born before 34 weeks of pregnancy. How is preterm labor treated? Treatment depends on the length of your pregnancy, your condition, and the health of your baby. It may involve:  Having a stitch (suture) placed in your cervix to prevent your cervix from opening too early (cerclage).  Taking or being given medicines, such as: ? Hormone medicines. These may be given early in pregnancy to help support the pregnancy. ? Medicine to stop contractions. ? Medicines to help mature the baby's lungs. These may be prescribed if the risk of delivery is high. ? Medicines to prevent your baby from developing cerebral palsy. If the labor  happens before 34 weeks of pregnancy, you may need to stay in the hospital. What should I do if I think I am in preterm labor? If you think that you are going into preterm labor, call your health care provider right away. How can I prevent preterm labor in future pregnancies? To increase your chance of having a full-term pregnancy:  Do not use any tobacco products, such as cigarettes, chewing tobacco, and e-cigarettes. If you need help quitting, ask your health care provider.  Do not use street drugs or medicines that have not been prescribed to you during your pregnancy.  Talk with your health care provider before taking any herbal supplements, even if you have been taking them regularly.  Make sure you gain a healthy amount of weight during your pregnancy.  Watch for infection. If you think that you might have an infection, get it checked right away.  Make sure to tell your health  care provider if you have gone into preterm labor before. This information is not intended to replace advice given to you by your health care provider. Make sure you discuss any questions you have with your health care provider. Document Released: 08/11/2003 Document Revised: 11/01/2015 Document Reviewed: 10/12/2015 Elsevier Interactive Patient Education  2019 [employer].  Coronavirus (COVID-19) Are you at risk?  Are you at risk for the Coronavirus (COVID-19)?  To be considered HIGH RISK for Coronavirus (COVID-19), you have to meet the following criteria:  . Traveled to [place], [place], [place], [place] or [place]; or in the [place] to [place], [place], [place], or [place]; and have fever, cough, and shortness of breath within the last 2 weeks of travel OR . Been in close contact with a person diagnosed with COVID-19 within the last 2 weeks and have fever, cough, and shortness of breath . IF YOU DO NOT MEET THESE CRITERIA, YOU ARE CONSIDERED LOW RISK FOR COVID-19.  What to do if you are HIGH  RISK for COVID-19?  Marland Kitchen If you are having a medical emergency, call 911. . Seek medical care right away. Before you go to a doctor's office, urgent care or emergency department, call ahead and tell them about your recent travel, contact with someone diagnosed with COVID-19, and your symptoms. You should receive instructions from your physician's office regarding next steps of care.  . When you arrive at healthcare provider, tell the healthcare staff immediately you have returned from visiting [place], [place], [place], [place] or [place]; or traveled in the [place] to [place], [place], [place], or [place]; in the last two weeks or you have been in close contact with a person diagnosed with COVID-19 in the last 2 weeks.   . Tell the health care staff about your symptoms: fever, cough and shortness of breath. . After you have been seen by a medical provider, you will be either: o Tested for (COVID-19) and discharged home on quarantine except to seek medical care if symptoms worsen, and asked to  - Stay home and avoid contact with others until you get your results (4-5 days)  - Avoid travel on public transportation if possible (such as bus, train, or airplane) or o Sent to the Emergency Department by EMS for evaluation, COVID-19 testing, and possible admission depending on your condition and test results.  What to do if you are LOW RISK for COVID-19?  Reduce your risk of any infection by using the same precautions used for avoiding the common cold or flu:  Marland Kitchen Wash your hands often with soap and warm water for at least 20 seconds.  If soap and water are not readily available, use an alcohol-based hand sanitizer with at least 60% alcohol.  . If coughing or sneezing, cover your mouth and nose by coughing or sneezing into the elbow areas of your shirt or coat, into a tissue or into your sleeve (not your hands). . Avoid shaking hands with others and consider head nods or verbal greetings only. .  Avoid touching your eyes, nose, or mouth with unwashed hands.  . Avoid close contact with people who are sick. . Avoid places or events with large numbers of people in one location, like concerts or sporting events. . Carefully consider travel plans you have or are making. . If you are planning any travel outside or inside the [place], visit the CDC's Travelers' Health webpage for the latest health notices. . If you have some  symptoms but not all symptoms, continue to monitor at home and seek medical attention if your symptoms worsen. . If you are having a medical emergency, call 911.   ADDITIONAL HEALTHCARE OPTIONS FOR PATIENTS  [place] [facility] / e-Visit: [email or web address]         [facility]: [phone]  [facility]: [phone]                   [facility]: [phone]

## 2018-10-01 NOTE — Progress Notes (Signed)
[place] 36 wks,cephalic,posterior placenta gr 3,afi 13 cm,fhr 140 bpm,efw 2716 g 40%

## 2018-10-03 LAB — GC/CHLAMYDIA PROBE AMP
Chlamydia trachomatis, NAA: NEGATIVE
Neisseria Gonorrhoeae by PCR: NEGATIVE

## 2018-10-03 LAB — URINE CULTURE

## 2018-10-03 LAB — STREP GP B NAA+RFLX: Strep Gp B NAA+Rflx: NEGATIVE

## 2018-10-08 ENCOUNTER — Ambulatory Visit (INDEPENDENT_AMBULATORY_CARE_PROVIDER_SITE_OTHER): Payer: Medicaid Other | Admitting: Obstetrics and Gynecology

## 2018-10-08 ENCOUNTER — Other Ambulatory Visit: Payer: Self-pay

## 2018-10-08 ENCOUNTER — Encounter: Payer: Self-pay | Admitting: Obstetrics and Gynecology

## 2018-10-08 VITALS — BP 130/86 | HR 98 | Wt 200.0 lb

## 2018-10-08 DIAGNOSIS — Z3483 Encounter for supervision of other normal pregnancy, third trimester: Secondary | ICD-10-CM

## 2018-10-08 DIAGNOSIS — Z3A37 37 weeks gestation of pregnancy: Secondary | ICD-10-CM

## 2018-10-08 DIAGNOSIS — Z8759 Personal history of other complications of pregnancy, childbirth and the puerperium: Secondary | ICD-10-CM

## 2018-10-08 NOTE — Progress Notes (Signed)
   TELEHEALTH VIRTUAL OBSTETRICS PRENATAL VISIT ENCOUNTER NOTE  I connected with Naziyah Demello on 10/08/18 at 10:15 AM EDT by WebEx at home and verified that I am speaking with the correct person using two identifiers.   I discussed the limitations, risks, security and privacy concerns of performing an evaluation and management service by telephone and the availability of in person appointments. I also discussed with the patient that there may be a patient responsible charge related to this service. The patient expressed understanding and agreed to proceed. Subjective:  Alicia Small is a 28 y.o. G2P1001 at [redacted]w[redacted]d being seen today for ongoing prenatal care.  She is currently monitored for the following issues for this low-risk pregnancy and has Abnormal Pap smear of cervix; Marijuana use; History of prior pregnancy with IUGR newborn; History of gestational hypertension; and Supervision of normal pregnancy on their problem list.  Patient reports general discomforts of pregnancy.  Reports fetal movement. Contractions: Irregular. Vag. Bleeding: None.  Movement: Present. Denies any contractions, bleeding or leaking of fluid.   The following portions of the patient's history were reviewed and updated as appropriate: allergies, current medications, past family history, past medical history, past social history, past surgical history and problem list.   Objective:   Vitals:   10/08/18 1029  BP: 130/86  Pulse: 98  Weight: 200 lb (90.7 kg)    Fetal Status:     Movement: Present     General:  Alert, oriented and cooperative. Patient is in no acute distress.  Respiratory: Normal respiratory effort, no problems with respiration noted  Mental Status: Normal mood and affect. Normal behavior. Normal judgment and thought content.  Rest of physical exam deferred due to type of encounter  Assessment and Plan:  Pregnancy: G2P1001 at [redacted]w[redacted]d 1. Encounter for supervision of other normal pregnancy in third  trimester Stable  2. History of gestational hypertension BP's 120-130's/70-80's at home per pt No S/Sx of PEC Continue with BASA  3. History of prior pregnancy with IUGR newborn Growth last week 40%  Term labor symptoms and general obstetric precautions including but not limited to vaginal bleeding, contractions, leaking of fluid and fetal movement were reviewed in detail with the patient. I discussed the assessment and treatment plan with the patient. The patient was provided an opportunity to ask questions and all were answered. The patient agreed with the plan and demonstrated an understanding of the instructions. The patient was advised to call back or seek an in-person office evaluation/go to MAU at [facility] for any urgent or concerning symptoms. Please refer to After Visit Summary for other counseling recommendations.   I provided 11 minutes of face-to-face via WebEx time during this encounter.  Return in about 1 week (around 10/15/2018) for OB visit, televisit.  No future appointments.  Hermina Staggers, MD Center for [facility], [facility]

## 2018-10-13 ENCOUNTER — Inpatient Hospital Stay (HOSPITAL_COMMUNITY)
Admission: AD | Admit: 2018-10-13 | Discharge: 2018-10-15 | DRG: 807 | Disposition: A | Discharge status: Home / Self Care | Payer: Medicaid Other | Attending: Obstetrics and Gynecology | Admitting: Obstetrics and Gynecology

## 2018-10-13 ENCOUNTER — Inpatient Hospital Stay (HOSPITAL_COMMUNITY): Payer: Medicaid Other | Admitting: Anesthesiology

## 2018-10-13 ENCOUNTER — Telehealth: Payer: Self-pay | Admitting: Obstetrics & Gynecology

## 2018-10-13 ENCOUNTER — Encounter (HOSPITAL_COMMUNITY): Payer: Self-pay | Admitting: *Deleted

## 2018-10-13 ENCOUNTER — Other Ambulatory Visit: Payer: Self-pay

## 2018-10-13 ENCOUNTER — Telehealth: Payer: Self-pay | Admitting: *Deleted

## 2018-10-13 DIAGNOSIS — O99324 Drug use complicating childbirth: Secondary | ICD-10-CM | POA: Diagnosis present

## 2018-10-13 DIAGNOSIS — O134 Gestational [pregnancy-induced] hypertension without significant proteinuria, complicating childbirth: Principal | ICD-10-CM | POA: Diagnosis present

## 2018-10-13 DIAGNOSIS — Z880 Allergy status to penicillin: Secondary | ICD-10-CM

## 2018-10-13 DIAGNOSIS — Z3A37 37 weeks gestation of pregnancy: Secondary | ICD-10-CM

## 2018-10-13 DIAGNOSIS — Z3483 Encounter for supervision of other normal pregnancy, third trimester: Secondary | ICD-10-CM

## 2018-10-13 DIAGNOSIS — R030 Elevated blood-pressure reading, without diagnosis of hypertension: Secondary | ICD-10-CM | POA: Diagnosis present

## 2018-10-13 DIAGNOSIS — O133 Gestational [pregnancy-induced] hypertension without significant proteinuria, third trimester: Secondary | ICD-10-CM | POA: Diagnosis present

## 2018-10-13 DIAGNOSIS — Z1159 Encounter for screening for other viral diseases: Secondary | ICD-10-CM

## 2018-10-13 DIAGNOSIS — F1290 Cannabis use, unspecified, uncomplicated: Secondary | ICD-10-CM | POA: Diagnosis present

## 2018-10-13 LAB — TYPE AND SCREEN
ABO/RH(D): B POS
Antibody Screen: NEGATIVE

## 2018-10-13 LAB — CBC
HCT: 38.6 % (ref 36.0–46.0)
Hemoglobin: 12.9 g/dL (ref 12.0–15.0)
MCH: 29.1 pg (ref 26.0–34.0)
MCHC: 33.4 g/dL (ref 30.0–36.0)
MCV: 86.9 fL (ref 80.0–100.0)
Platelets: 310 10*3/uL (ref 150–400)
RBC: 4.44 MIL/uL (ref 3.87–5.11)
RDW: 13 % (ref 11.5–15.5)
WBC: 13.9 10*3/uL — ABNORMAL HIGH (ref 4.0–10.5)
nRBC: 0 % (ref 0.0–0.2)

## 2018-10-13 LAB — URINALYSIS, ROUTINE W REFLEX MICROSCOPIC
Bilirubin Urine: NEGATIVE
Glucose, UA: NEGATIVE mg/dL
Hgb urine dipstick: NEGATIVE
Ketones, ur: 80 mg/dL — AB
Nitrite: NEGATIVE
Protein, ur: 100 mg/dL — AB
Specific Gravity, Urine: 1.025 (ref 1.005–1.030)
pH: 6 (ref 5.0–8.0)

## 2018-10-13 LAB — PROTEIN / CREATININE RATIO, URINE
Creatinine, Urine: 333.54 mg/dL
Protein Creatinine Ratio: 0.13 mg/mg{Cre} (ref 0.00–0.15)
Total Protein, Urine: 44 mg/dL

## 2018-10-13 LAB — SARS CORONAVIRUS 2 BY RT PCR (HOSPITAL ORDER, PERFORMED IN ~~LOC~~ HOSPITAL LAB): SARS Coronavirus 2: NEGATIVE

## 2018-10-13 LAB — COMPREHENSIVE METABOLIC PANEL
ALT: 16 U/L (ref 0–44)
AST: 22 U/L (ref 15–41)
Albumin: 2.8 g/dL — ABNORMAL LOW (ref 3.5–5.0)
Alkaline Phosphatase: 169 U/L — ABNORMAL HIGH (ref 38–126)
Anion gap: 12 (ref 5–15)
BUN: 6 mg/dL (ref 6–20)
CO2: 21 mmol/L — ABNORMAL LOW (ref 22–32)
Calcium: 8.7 mg/dL — ABNORMAL LOW (ref 8.9–10.3)
Chloride: 103 mmol/L (ref 98–111)
Creatinine, Ser: 0.89 mg/dL (ref 0.44–1.00)
GFR calc Af Amer: >60 mL/min (ref >60)
GFR calc non Af Amer: >60 mL/min (ref >60)
Glucose, Bld: 85 mg/dL (ref 70–99)
Potassium: 4.4 mmol/L (ref 3.5–5.1)
Sodium: 136 mmol/L (ref 135–145)
Total Bilirubin: 0.6 mg/dL (ref 0.3–1.2)
Total Protein: 6.4 g/dL — ABNORMAL LOW (ref 6.5–8.1)

## 2018-10-13 LAB — RAPID URINE DRUG SCREEN, HOSP PERFORMED
Amphetamines: NOT DETECTED
Barbiturates: NOT DETECTED
Benzodiazepines: NOT DETECTED
Cocaine: NOT DETECTED
Opiates: NOT DETECTED
Tetrahydrocannabinol: POSITIVE — AB

## 2018-10-13 MED ORDER — OXYTOCIN 40 UNITS IN NORMAL SALINE INFUSION - SIMPLE MED
2.5000 [IU]/h | INTRAVENOUS | Status: DC
  Administered 2018-10-13: 23:00:00 2.5 [IU]/h via INTRAVENOUS

## 2018-10-13 MED ORDER — LIDOCAINE HCL (PF) 1 % IJ SOLN
INTRAMUSCULAR | Status: DC | PRN
  Administered 2018-10-13 (×2): 5 mL via EPIDURAL

## 2018-10-13 MED ORDER — ONDANSETRON HCL 4 MG/2ML IJ SOLN: 4.0000 mg | Freq: Four times a day (QID) | INTRAMUSCULAR | Status: DC | PRN

## 2018-10-13 MED ORDER — ACETAMINOPHEN 325 MG PO TABS: 650.0000 mg | ORAL_TABLET | ORAL | Status: DC | PRN

## 2018-10-13 MED ORDER — ERYTHROMYCIN 5 MG/GM OP OINT
TOPICAL_OINTMENT | OPHTHALMIC | Status: AC
  Filled 2018-10-13: qty 1

## 2018-10-13 MED ORDER — MISOPROSTOL 50MCG HALF TABLET: 50.0000 ug | ORAL_TABLET | ORAL | Status: DC | PRN

## 2018-10-13 MED ORDER — OXYCODONE-ACETAMINOPHEN 5-325 MG PO TABS: 2.0000 | ORAL_TABLET | ORAL | Status: DC | PRN

## 2018-10-13 MED ORDER — LACTATED RINGERS IV SOLN
INTRAVENOUS | Status: DC
  Administered 2018-10-13: 17:00:00 via INTRAVENOUS

## 2018-10-13 MED ORDER — PHENYLEPHRINE 40 MCG/ML (10ML) SYRINGE FOR IV PUSH (FOR BLOOD PRESSURE SUPPORT): 80.0000 ug | PREFILLED_SYRINGE | INTRAVENOUS | Status: DC | PRN

## 2018-10-13 MED ORDER — FENTANYL CITRATE (PF) 100 MCG/2ML IJ SOLN: 50.0000 ug | INTRAMUSCULAR | Status: DC | PRN

## 2018-10-13 MED ORDER — TERBUTALINE SULFATE 1 MG/ML IJ SOLN: 0.2500 mg | Freq: Once | INTRAMUSCULAR | Status: DC | PRN

## 2018-10-13 MED ORDER — LACTATED RINGERS IV SOLN: 500.0000 mL | Freq: Once | INTRAVENOUS | Status: DC

## 2018-10-13 MED ORDER — EPHEDRINE 5 MG/ML INJ: 10.0000 mg | INTRAVENOUS | Status: DC | PRN

## 2018-10-13 MED ORDER — OXYTOCIN 40 UNITS IN NORMAL SALINE INFUSION - SIMPLE MED
1.0000 m[IU]/min | INTRAVENOUS | Status: DC
  Administered 2018-10-13: 2 m[IU]/min via INTRAVENOUS
  Filled 2018-10-13: qty 1000

## 2018-10-13 MED ORDER — SODIUM CHLORIDE (PF) 0.9 % IJ SOLN
INTRAMUSCULAR | Status: DC | PRN
  Administered 2018-10-13: 14 mL/h via EPIDURAL

## 2018-10-13 MED ORDER — FENTANYL-BUPIVACAINE-NACL 0.5-0.125-0.9 MG/250ML-% EP SOLN
12.0000 mL/h | EPIDURAL | Status: DC | PRN
  Filled 2018-10-13: qty 250

## 2018-10-13 MED ORDER — DIPHENHYDRAMINE HCL 50 MG/ML IJ SOLN: 12.5000 mg | INTRAMUSCULAR | Status: DC | PRN

## 2018-10-13 MED ORDER — OXYCODONE-ACETAMINOPHEN 5-325 MG PO TABS: 1.0000 | ORAL_TABLET | ORAL | Status: DC | PRN

## 2018-10-13 MED ORDER — LIDOCAINE HCL (PF) 1 % IJ SOLN: 30.0000 mL | INTRAMUSCULAR | Status: DC | PRN

## 2018-10-13 MED ORDER — SOD CITRATE-CITRIC ACID 500-334 MG/5ML PO SOLN: 30.0000 mL | ORAL | Status: DC | PRN

## 2018-10-13 MED ORDER — LACTATED RINGERS IV SOLN: 500.0000 mL | INTRAVENOUS | Status: DC | PRN

## 2018-10-13 MED ORDER — OXYTOCIN BOLUS FROM INFUSION
500.0000 mL | Freq: Once | INTRAVENOUS | Status: AC
  Administered 2018-10-13: 23:00:00 500 mL via INTRAVENOUS

## 2018-10-13 NOTE — MAU Note (Signed)
COVID swab done for admission. Specimen sent to lab

## 2018-10-13 NOTE — Progress Notes (Signed)
Labor Progress Note Jawanda Carannante is a 28 y.o. G2P1001 at [redacted]w[redacted]d presented for IOL for gHTN  S:  Comfortable with epidural. Denies HA, visual disturbances, RUQ pain, SOB, and CP.  O:  BP 128/86   Pulse 97   Temp 98.1 F (36.7 C) (Oral)   Resp 18   Ht 5\' 4"  (1.626 m)   Wt 89.7 kg   LMP 01/22/2018   SpO2 100%   BMI 33.94 kg/m  EFM: baseline 135 bpm/ mod variability/ + accels/ no decels  Toco: 2-3 SVE: Dilation: 4.5 Effacement (%): 70 Cervical Position: Posterior Station: -2 Presentation: Vertex Exam by:: Hermenia Bers, RN Pitocin: 4 mu/min  A/P: 28 y.o. G2P1001 [redacted]w[redacted]d  1. Labor: early active 2. FWB: Cat I 3. Pain: epidural 4. gHTN- stable  Continue Pitocin. Anticipate labor progression and SVD.  Donette Larry, CNM 8:49 PM

## 2018-10-13 NOTE — Discharge Summary (Addendum)
Postpartum Discharge Summary     Patient Name: Alicia Small DOB: 22-Apr-1991 MRN: [id number]  Date of admission: 10/13/2018 Delivering Provider: Donette Larry   Date of discharge: 10/15/2018  Admitting diagnosis: hypertension 37 wks Intrauterine pregnancy: [redacted]w[redacted]d     Secondary diagnosis:  Active Problems:   Gestational hypertension, third trimester   SVD (spontaneous vaginal delivery)  Additional problems: marijuana use, previous IUGR newborn     Discharge diagnosis: Term Pregnancy Delivered and Gestational Hypertension                                                                                    Post partum procedures:NA  Augmentation: Pitocin and Foley Balloon  Complications: None  Hospital course:  Induction of Labor With Vaginal Delivery   28 y.o. yo G2P1001 at [redacted]w[redacted]d was admitted to the hospital 10/13/2018 for induction of labor.  Indication for induction: Gestational hypertension.  Patient had an uncomplicated labor course as follows: Membrane Rupture Time/Date: 3:54 PM ,10/13/2018   Intrapartum Procedures: Episiotomy: None [1]                                         Lacerations:  2nd degree [3]  Patient had delivery of a Viable infant.  Information for the patient's newborn:  Sabino Niemann Girl Taylia [[id number]]  Delivery Method: Vag-Spont   10/13/2018  Details of delivery can be found in separate delivery note.  Patient had a routine postpartum course. BP remained slightly elevated but no S/Sx of PEC. Pt was started on Vasotec daily.  Patient is discharged home 10/15/18.  Magnesium Sulfate recieved: No BMZ received: No  Physical exam  Vitals:   10/14/18 1333 10/14/18 1442 10/14/18 2111 10/15/18 0538  BP: (!) 132/95 (!) 133/94 (!) 132/92 (!) 143/92  Pulse: 89 86 87 81  Resp: 18 15 18 18   Temp: 98 F (36.7 C) 98.3 F (36.8 C) 98.3 F (36.8 C) 98.2 F (36.8 C)  TempSrc: Oral  Oral Oral  SpO2:  100%    Weight:      Height:       General: alert Lochia:  appropriate Uterine Fundus: firm Incision: Healing well with no significant drainage DVT Evaluation: No evidence of DVT seen on physical exam. Labs: Lab Results  Component Value Date   WBC 17.5 (H) 10/13/2018   HGB 12.0 10/13/2018   HCT 36.5 10/13/2018   MCV 88.6 10/13/2018   PLT 304 10/13/2018   CMP Latest Ref Rng & Units 10/13/2018  Glucose 70 - 99 mg/dL 85  BUN 6 - 20 mg/dL 6  Creatinine 9.93 - 7.16 mg/dL 9.67  Sodium 893 - 810 mmol/L 136  Potassium 3.5 - 5.1 mmol/L 4.4  Chloride 98 - 111 mmol/L 103  CO2 22 - 32 mmol/L 21(L)  Calcium 8.9 - 10.3 mg/dL 1.7(P)  Total Protein 6.5 - 8.1 g/dL 6.4(L)  Total Bilirubin 0.3 - 1.2 mg/dL 0.6  Alkaline Phos 38 - 126 U/L 169(H)  AST 15 - 41 U/L 22  ALT 0 - 44 U/L 16    Discharge instruction: per After  Visit Summary and "Baby and Me Booklet".  After visit meds:  Allergies as of 10/15/2018      Reactions   Penicillins Hives   Has patient had a PCN reaction causing immediate rash, facial/tongue/throat swelling, SOB or lightheadedness with hypotension: no Has patient had a PCN reaction causing severe rash involving mucus membranes or skin necrosis:yes Has patient had a PCN reaction that required hospitalizationno Has patient had a PCN reaction occurring within the last 10 years no If all of the above answers are "NO", then may proceed with Cephalosporin use.      Medication List    STOP taking these medications   promethazine 25 MG tablet Commonly known as:  PHENERGAN     TAKE these medications   enalapril 5 MG tablet Commonly known as:  VASOTEC Take 1 tablet (5 mg total) by mouth daily.   ibuprofen 600 MG tablet Commonly known as:  ADVIL Take 1 tablet (600 mg total) by mouth every 6 (six) hours.   omeprazole 20 MG capsule Commonly known as:  PRILOSEC Take 1 capsule (20 mg total) by mouth daily.   prenatal multivitamin Tabs tablet Take 1 tablet by mouth daily at 12 noon.       Diet: routine diet  Activity: Advance  as tolerated. Pelvic rest for 6 weeks.   Outpatient follow up:1 week for BP check Follow up Appt: Future Appointments  Date Time Provider Department Center  10/20/2018 10:15 AM [facility]  11/17/2018 10:15 AM Cresenzo-Dishmon, Scarlette CalicoFrances, CNM [facility]   Follow up Visit: Follow-up Information    [facility]. Schedule an appointment as soon as possible for a visit in 1 week(s).   Specialty:  Obstetrics and Gynecology Why:  1 week for BP check 4 weeks for PP visit Contact information: [address] [place] [id number] [phone]           Please schedule this patient for Postpartum visit in: 1 week with the following provider: Any provider For C/S patients schedule nurse incision check in weeks 2 weeks: no Low risk pregnancy complicated by: HTN Delivery mode:  SVD Anticipated Birth Control:  Condoms PP Procedures needed: BP check  Schedule Integrated BH visit: no      Newborn Data: Live born female  Birth Weight:   APGAR: 7, 9  Newborn Delivery   Birth date/time:  10/13/2018 22:42:00 Delivery type:  Vaginal, Spontaneous     Baby Feeding: Breast Disposition:home with mother   10/15/2018 Hermina StaggersMichael L Doxie Augenstein, MD

## 2018-10-13 NOTE — Anesthesia Preprocedure Evaluation (Signed)
Anesthesia Evaluation  Patient identified by MRN, date of birth, ID band Patient awake    Reviewed: Allergy & Precautions, H&P , NPO status , Patient's Chart, lab work & pertinent test results  History of Anesthesia Complications Negative for: history of anesthetic complications  Airway Mallampati: II  TM Distance: >3 FB Neck ROM: full    Dental no notable dental hx. (+) Teeth Intact   Pulmonary neg pulmonary ROS,    Pulmonary exam normal breath sounds clear to auscultation       Cardiovascular hypertension, Normal cardiovascular exam Rhythm:regular Rate:Normal     Neuro/Psych negative neurological ROS  negative psych ROS   GI/Hepatic negative GI ROS, Neg liver ROS,   Endo/Other  negative endocrine ROS  Renal/GU negative Renal ROS  negative genitourinary   Musculoskeletal   Abdominal (+) + obese,   Peds  Hematology negative hematology ROS (+)   Anesthesia Other Findings   Reproductive/Obstetrics (+) Pregnancy                             Anesthesia Physical Anesthesia Plan  ASA: II  Anesthesia Plan: Epidural   Post-op Pain Management:    Induction:   PONV Risk Score and Plan:   Airway Management Planned:   Additional Equipment:   Intra-op Plan:   Post-operative Plan:   Informed Consent: I have reviewed the patients History and Physical, chart, labs and discussed the procedure including the risks, benefits and alternatives for the proposed anesthesia with the patient or authorized representative who has indicated his/her understanding and acceptance.       Plan Discussed with:   Anesthesia Plan Comments:         Anesthesia Quick Evaluation

## 2018-10-13 NOTE — Anesthesia Procedure Notes (Signed)
Epidural Patient location during procedure: OB Start time: 10/13/2018 7:48 PM End time: 10/13/2018 7:58 PM  Staffing Anesthesiologist: Leonides Grills, MD Performed: anesthesiologist   Preanesthetic Checklist Completed: patient identified, site marked, pre-op evaluation, timeout performed, IV checked, risks and benefits discussed and monitors and equipment checked  Epidural Patient position: sitting Prep: DuraPrep Patient monitoring: heart rate, cardiac monitor, continuous pulse ox and blood pressure Approach: midline Location: L4-L5 Injection technique: LOR air  Needle:  Needle type: Tuohy  Needle gauge: 17 G Needle length: 9 cm Needle insertion depth: 6 cm Catheter type: closed end flexible Catheter size: 19 Gauge Catheter at skin depth: 11 cm Test dose: negative and Other  Assessment Events: blood not aspirated, injection not painful, no injection resistance and negative IV test  Additional Notes Informed consent obtained prior to proceeding including risk of failure, 1% risk of PDPH, risk of minor discomfort and bruising. Discussed alternatives to epidural analgesia and patient desires to proceed.  Timeout performed pre-procedure verifying patient name, procedure, and platelet count.  Patient tolerated procedure well. Reason for block:procedure for pain

## 2018-10-13 NOTE — H&P (Signed)
LABOR AND DELIVERY ADMISSION HISTORY AND PHYSICAL NOTE  Alicia Small is a 28 y.o. female G2P1001 with IUP at 6849w5d by LMP presenting to MAU for elevated BP. She reports elevated BP over the weekend that continued today.  She reports taking her BP this morning which was 142/101, 133/96 then 150/104. She denies HA or vision changes at this time, reports on and off HA over the weekend. Denies RUQ pain. She reports positive fetal movement. She denies leakage of fluid or vaginal bleeding.  Prenatal History/Complications: PNC at [facility]  Pregnancy complications:  - Gestational hypertension  - Hx of prior pregnancy with IUGR newborn  - Marijuana use   EFW 2716 (40%)  @ 36wks, cephalic presentation and posterior placenta   Past Medical History: Past Medical History:  Diagnosis Date  . Abnormal vaginal Pap smear   . Pregnancy induced hypertension   . Vaginal Pap smear, abnormal     Past Surgical History: Past Surgical History:  Procedure Laterality Date  . NO PAST SURGERIES      Obstetrical History: OB History    Gravida  2   Para  1   Term  1   Preterm      AB      Living  1     SAB      TAB      Ectopic      Multiple  0   Live Births  1           Social History: Social History   Socioeconomic History  . Marital status: Single    Spouse name: Alicia Rankinyler Mule   . Number of children: 1  . Years of education: 5514  . Highest education level: High school graduate  Occupational History  . Not on file  Social Needs  . Financial resource strain: Not hard at all  . Food insecurity:    Worry: Never true    Inability: Never true  . Transportation needs:    Medical: No    Non-medical: No  Tobacco Use  . Smoking status: Never Smoker  . Smokeless tobacco: Never Used  Substance and Sexual Activity  . Alcohol use: Not Currently    Comment: rarely; not now  . Drug use: No    Types: Marijuana    Comment: prior to pregnancy  . Sexual activity: Yes    Birth  control/protection: None  Lifestyle  . Physical activity:    Days per week: 0 days    Minutes per session: 0 min  . Stress: To some extent  Relationships  . Social connections:    Talks on phone: Once a week    Gets together: Once a week    Attends religious service: Never    Active member of club or organization: No    Attends meetings of clubs or organizations: Never    Relationship status: Living with partner  Other Topics Concern  . Not on file  Social History Narrative  . Not on file    Family History: Family History  Problem Relation Age of Onset  . Hypertension Mother   . Diabetes Maternal Grandfather   . Cancer Maternal Grandfather        lung  . Stroke Maternal Grandfather   . Other Paternal Grandfather        polyps  . Hypertension Father   . Autism Brother   . Asthma Maternal Grandmother   . Thyroid disease Maternal Grandmother     Allergies: Allergies  Allergen  Reactions  . Penicillins Hives    Has patient had a PCN reaction causing immediate rash, facial/tongue/throat swelling, SOB or lightheadedness with hypotension: no Has patient had a PCN reaction causing severe rash involving mucus membranes or skin necrosis:yes Has patient had a PCN reaction that required hospitalizationno Has patient had a PCN reaction occurring within the last 10 years no If all of the above answers are "NO", then may proceed with Cephalosporin use.     Medications Prior to Admission  Medication Sig Dispense Refill Last Dose  . omeprazole (PRILOSEC) 20 MG capsule Take 1 capsule (20 mg total) by mouth daily. 30 capsule 1 10/12/2018 at 1800  . Prenatal Multivit-Min-Fe-FA (PRENATAL VITAMINS PO) Take by mouth daily.   10/12/2018 at 1200  . promethazine (PHENERGAN) 25 MG tablet Take 0.5-1 tablets (12.5-25 mg total) by mouth every 6 (six) hours as needed for nausea or vomiting. 30 tablet 0 Past Week at Unknown time     Review of Systems  All systems reviewed and negative except as  stated in HPI  Physical Exam Blood pressure (!) 136/92, pulse (!) 111, temperature 98.3 F (36.8 C), temperature source Oral, resp. rate 18, height 5\' 4"  (1.626 m), weight 89.7 kg, last menstrual period 01/22/2018, SpO2 100 %. General appearance: alert, cooperative and no distress Lungs: clear to auscultation bilaterally Heart: regular rate and rhythm Abdomen: soft, non-tender; bowel sounds normal Extremities: No calf swelling or tenderness Presentation: cephalic by cervical examination  Fetal monitoring: 140/moderate/ +accels/ no decelerations  Uterine activity: occasional mild contractions  Dilation: 1.5 Effacement (%): Thick Station: -3 Exam by:: Alicia Small, CNM  Prenatal labs: ABO, Rh: B/Positive/-- (10/30 1126) Antibody: Negative (03/09 0900) Rubella: 1.03 (10/30 1126) RPR: Non Reactive (03/09 0900)  HBsAg: Negative (10/30 1126)  HIV: Non Reactive (03/09 0900)  GC/Chlamydia: negative  GBS:   negative  2 hr Glucola: 33-295-18 Genetic screening:  Declined  Anatomy US: normal female    [facility]  LAB RESULTS  Language English Pap 12/19/15 neg  Initiated care at 10wk GC/CT Initial:   -/-         36wks:   -/-  Dating by LMP c/w 7wk U/S    Support person Alicia Small Genetics NT/IT:        AFP:         cfDNA:    Embden/HgbE   Flu vaccine 04/02/18 CF 04/09/16 neg  TDaP vaccine 04/02/18 SMA   Rhogam       Blood Type B/Positive/-- (10/30 1126)  Anatomy US Normal female 'Iris' Antibody Negative (10/30 1126)  Feeding Plan breast HBsAg Negative (10/30 1126)  Contraception depo RPR Non Reactive (10/30 1126)  Circumcision n/a Rubella  1.03 (10/30 1126)  Pediatrician [facility] HIV Non Reactive (10/30 1126)  Prenatal Classes declined      GTT/A1C Early:      26-28wks:87/167/95  BTL Consent  GBS   neg     [ ]  PCN allergy  VBAC Consent n/a    Waterbirth [ ] Class [ ] Consent [ ] CNM visit PP Needs      Prenatal Transfer Tool  Maternal Diabetes: No Genetic Screening:  Declined Maternal Ultrasounds/Referrals: Normal Fetal Ultrasounds or other Referrals:  None Maternal Substance Abuse:  Yes:  Type: Marijuana Significant Maternal Medications:  None Significant Maternal Lab Results: Lab values include: Group B Strep negative  Results for orders placed or performed during the hospital encounter of 10/13/18 (from the past 24 hour(s))  Protein / creatinine ratio, urine  Collection Time: 10/13/18  2:15 PM  Result Value Ref Range   Creatinine, Urine 333.54 mg/dL   Total Protein, Urine 44 mg/dL   Protein Creatinine Ratio 0.13 0.00 - 0.15 mg/mg[Cre]  Urinalysis, Routine w reflex microscopic   Collection Time: 10/13/18  2:15 PM  Result Value Ref Range   Color, Urine AMBER (A) YELLOW   APPearance CLOUDY (A) CLEAR   Specific Gravity, Urine 1.025 1.005 - 1.030   pH 6.0 5.0 - 8.0   Glucose, UA NEGATIVE NEGATIVE mg/dL   Hgb urine dipstick NEGATIVE NEGATIVE   Bilirubin Urine NEGATIVE NEGATIVE   Ketones, ur 80 (A) NEGATIVE mg/dL   Protein, ur 161 (A) NEGATIVE mg/dL   Nitrite NEGATIVE NEGATIVE   Leukocytes,Ua LARGE (A) NEGATIVE   RBC / HPF 0-5 0 - 5 RBC/hpf   WBC, UA 0-5 0 - 5 WBC/hpf   Bacteria, UA RARE (A) NONE SEEN   Squamous Epithelial / LPF 0-5 0 - 5   Mucus PRESENT   CBC   Collection Time: 10/13/18  2:32 PM  Result Value Ref Range   WBC 13.9 (H) 4.0 - 10.5 K/uL   RBC 4.44 3.87 - 5.11 MIL/uL   Hemoglobin 12.9 12.0 - 15.0 g/dL   HCT 09.6 04.5 - 40.9 %   MCV 86.9 80.0 - 100.0 fL   MCH 29.1 26.0 - 34.0 pg   MCHC 33.4 30.0 - 36.0 g/dL   RDW 81.1 91.4 - 78.2 %   Platelets 310 150 - 400 K/uL   nRBC 0.0 0.0 - 0.2 %  Comprehensive metabolic panel   Collection Time: 10/13/18  2:32 PM  Result Value Ref Range   Sodium 136 135 - 145 mmol/L   Potassium 4.4 3.5 - 5.1 mmol/L   Chloride 103 98 - 111 mmol/L   CO2 21 (L) 22 - 32 mmol/L   Glucose, Bld 85 70 - 99 mg/dL   BUN 6 6 - 20 mg/dL   Creatinine, Ser 9.56 0.44 - 1.00 mg/dL   Calcium 8.7 (L) 8.9 - 10.3  mg/dL   Total Protein 6.4 (L) 6.5 - 8.1 g/dL   Albumin 2.8 (L) 3.5 - 5.0 g/dL   AST 22 15 - 41 U/L   ALT 16 0 - 44 U/L   Alkaline Phosphatase 169 (H) 38 - 126 U/L   Total Bilirubin 0.6 0.3 - 1.2 mg/dL   GFR calc non Af Amer >60 >60 mL/min   GFR calc Af Amer >60 >60 mL/min   Anion gap 12 5 - 15    Patient Active Problem List   Diagnosis Date Noted  . Gestational hypertension, third trimester 10/13/2018  . Supervision of normal pregnancy 04/02/2018  . History of prior pregnancy with IUGR newborn 07/19/2016  . History of gestational hypertension 07/19/2016  . Marijuana use 12/21/2015  . Abnormal Pap smear of cervix 07/27/2013    Assessment: Manika Hast is a 28 y.o. G2P1001 at [redacted]w[redacted]d here for IOL for GHTN.   #Labor: IOL with FB and cytotec, FB placed in MAU  #Pain: Pain medication ordered PRN  #FWB: Cat I  #ID:  GBS neg  #MOF: Breast  #MOC:Condoms  #Circ:  N/a   Sharyon Cable, CNM, 10/13/2018, 4:28 PM

## 2018-10-13 NOTE — MAU Note (Signed)
Pt has elevated BP at home, 140s/100s. Has HA and seeing spots. Has not taken anything for HA. Rates 5/10.

## 2018-10-13 NOTE — Telephone Encounter (Signed)
Patient called stating that her BP has been high. Pt states that Selena Batten told her to let us know if her BP lower number goes higher that 90 and it has. Please contact pt

## 2018-10-13 NOTE — Telephone Encounter (Signed)
Patient states she has been checking her BP over the weekend and has noted several readings with diastolic >90. This morning after waking her BP was 142/101. Recheck in 1 hour was 133/96 and now currently is150/104.  Has been having headaches on/off but no visual disturbances.  Discussed with KRB and patient to go to [facility] for eval.  Reminded patient of visitor restrictions at the hospital. Verbalized understanding.

## 2018-10-14 ENCOUNTER — Encounter (HOSPITAL_COMMUNITY): Payer: Self-pay

## 2018-10-14 ENCOUNTER — Encounter: Payer: Medicaid Other | Admitting: Obstetrics & Gynecology

## 2018-10-14 LAB — CBC
HCT: 36.5 % (ref 36.0–46.0)
Hemoglobin: 12 g/dL (ref 12.0–15.0)
MCH: 29.1 pg (ref 26.0–34.0)
MCHC: 32.9 g/dL (ref 30.0–36.0)
MCV: 88.6 fL (ref 80.0–100.0)
Platelets: 304 10*3/uL (ref 150–400)
RBC: 4.12 MIL/uL (ref 3.87–5.11)
RDW: 13.1 % (ref 11.5–15.5)
WBC: 17.5 10*3/uL — ABNORMAL HIGH (ref 4.0–10.5)
nRBC: 0 % (ref 0.0–0.2)

## 2018-10-14 LAB — ABO/RH: ABO/RH(D): B POS

## 2018-10-14 LAB — RPR: RPR Ser Ql: NONREACTIVE

## 2018-10-14 MED ORDER — ONDANSETRON HCL 4 MG/2ML IJ SOLN: 4.0000 mg | INTRAMUSCULAR | Status: DC | PRN

## 2018-10-14 MED ORDER — SENNOSIDES-DOCUSATE SODIUM 8.6-50 MG PO TABS
2.0000 | ORAL_TABLET | ORAL | Status: DC
  Administered 2018-10-14: 2 via ORAL
  Filled 2018-10-14: qty 2

## 2018-10-14 MED ORDER — COCONUT OIL OIL
1.0000 "application " | TOPICAL_OIL | Status: DC | PRN
  Administered 2018-10-14: 1 via TOPICAL

## 2018-10-14 MED ORDER — DIPHENHYDRAMINE HCL 25 MG PO CAPS: 25.0000 mg | ORAL_CAPSULE | Freq: Four times a day (QID) | ORAL | Status: DC | PRN

## 2018-10-14 MED ORDER — BENZOCAINE-MENTHOL 20-0.5 % EX AERO: 1.0000 "application " | INHALATION_SPRAY | CUTANEOUS | Status: DC | PRN

## 2018-10-14 MED ORDER — ONDANSETRON HCL 4 MG PO TABS: 4.0000 mg | ORAL_TABLET | ORAL | Status: DC | PRN

## 2018-10-14 MED ORDER — DIBUCAINE (PERIANAL) 1 % EX OINT: 1.0000 "application " | TOPICAL_OINTMENT | CUTANEOUS | Status: DC | PRN

## 2018-10-14 MED ORDER — PRENATAL MULTIVITAMIN CH
1.0000 | ORAL_TABLET | Freq: Every day | ORAL | Status: DC
  Administered 2018-10-14 – 2018-10-15 (×2): 1 via ORAL
  Filled 2018-10-14 (×2): qty 1

## 2018-10-14 MED ORDER — IBUPROFEN 600 MG PO TABS
600.0000 mg | ORAL_TABLET | Freq: Four times a day (QID) | ORAL | Status: DC
  Administered 2018-10-14 – 2018-10-15 (×6): 600 mg via ORAL
  Filled 2018-10-14 (×6): qty 1

## 2018-10-14 MED ORDER — SIMETHICONE 80 MG PO CHEW: 80.0000 mg | CHEWABLE_TABLET | ORAL | Status: DC | PRN

## 2018-10-14 MED ORDER — TETANUS-DIPHTH-ACELL PERTUSSIS 5-2.5-18.5 LF-MCG/0.5 IM SUSP: 0.5000 mL | Freq: Once | INTRAMUSCULAR | Status: DC

## 2018-10-14 MED ORDER — WITCH HAZEL-GLYCERIN EX PADS: 1.0000 "application " | MEDICATED_PAD | CUTANEOUS | Status: DC | PRN

## 2018-10-14 MED ORDER — ACETAMINOPHEN 325 MG PO TABS: 650.0000 mg | ORAL_TABLET | ORAL | Status: DC | PRN

## 2018-10-14 NOTE — Anesthesia Postprocedure Evaluation (Signed)
Anesthesia Post Note  Patient: Alicia Small  Procedure(s) Performed: AN AD HOC LABOR EPIDURAL     Patient location during evaluation: Mother Baby Anesthesia Type: Epidural Level of consciousness: awake and alert Pain management: pain level controlled Vital Signs Assessment: post-procedure vital signs reviewed and stable Respiratory status: spontaneous breathing, nonlabored ventilation and respiratory function stable Cardiovascular status: stable Postop Assessment: no headache, no backache, epidural receding, no apparent nausea or vomiting, patient able to bend at knees, able to ambulate and adequate PO intake Anesthetic complications: no    Last Vitals:  Vitals:   10/14/18 0146 10/14/18 0545  BP: 136/85 135/83  Pulse: 83 80  Resp: 18 18  Temp: 36.8 C 36.9 C  SpO2:      Last Pain:  Vitals:   10/14/18 0545  TempSrc:   PainSc: 5    Pain Goal: Patients Stated Pain Goal: 10 (10/13/18 1900)                 Laban Emperor

## 2018-10-14 NOTE — Lactation Note (Signed)
This note was copied from a baby's chart. Lactation Consultation Note Baby 5 hrs old. Mom states baby is BF well. Mom has a 28 yr old that she states she couldn't BF d/t latching difficulty. Mom stated baby had a small mouth and couldn't latch after she went home.' Rt. Nipple flat. Lt. Nipple is very short shaft. Nipples are not large. Very compressible. Hand expression taught w/colostrum poured from breast.  Mom holding baby between breast STS. Baby rooting after hand expression. Mom demonstrated. Mom stated she had been feeding in cradle position. Discussed feeding positions and milk storage. Set up mom in football position. Baby latched gulping at the breast. Mom very excited. Taught mom "C" hold, breast massage. Mom kept pulling back on breast tissue. Explained cause to have shallow latch.  Newborn behavior, STS, I&O, cluster feeding, supplementing, supply and demand discussed. Baby wt. 6.1 lbs discussed w/mom may need to supplement w/BM after BF d/t wt. Will prob. Drop below 6 lbs. Encouraged mom to pump every 3 hrs to give baby colostrum.  Mom shown how to use DEBP & how to disassemble, clean, & reassemble parts. Mom in agreement and states understanding.  Mom to call for assistance or questions.  Lactation brochure given.   Patient Name: Alicia Small [id number] Date: 10/14/2018 Reason for consult: Initial assessment;Early term 37-38.6wks   Maternal Data Has patient been taught Hand Expression?: Yes Does the patient have breastfeeding experience prior to this delivery?: Yes  Feeding Feeding Type: Breast Fed  LATCH Score Latch: Grasps breast easily, tongue down, lips flanged, rhythmical sucking.  Audible Swallowing: Spontaneous and intermittent  Type of Nipple: Flat(Rt. flat, Lt. very short shaft)  Comfort (Breast/Nipple): Soft / non-tender  Hold (Positioning): Assistance needed to correctly position infant at breast and maintain latch.  LATCH Score:  8  Interventions Interventions: Breast feeding basics reviewed;Adjust position;DEBP;Assisted with latch;Support pillows;Skin to skin;Position options;Breast massage;Expressed milk;Hand express;Pre-pump if needed;Shells;Breast compression;Hand pump  Lactation Tools Discussed/Used Tools: Shells;Pump Shell Type: Inverted Breast pump type: Double-Electric Breast Pump;Manual Pump Review: Setup, frequency, and cleaning;Milk Storage Initiated by:: Peri Jefferson RN IBCLC Date initiated:: 10/14/18   Consult Status Consult Status: Follow-up Date: 10/15/18 Follow-up type: In-patient    Charyl Dancer 10/14/2018, 3:53 AM

## 2018-10-14 NOTE — Lactation Note (Signed)
This note was copied from a baby's chart. Lactation Consultation Note  Patient Name: [name] [id number] Date: 10/14/2018 Reason for consult: Follow-up assessment;Early term 34-38.6wks  P2 mother whose infant is now 59 hours old.  This is an ETI at 12+106 days old.  Mother breast fed her first child for only a few days.  Mother had baby latched onto the left breast in the cradle hold when I arrived.  Noticed that baby was not latched deeply enough and was sliding down in mother's arm.  Offered to assist and mother accepted.  Demonstrated better hand support and changed from the cradle position to the cross cradle position.  Infant latched well and immediately began sucking when latched.  Mother denied pain and felt a gentle tugging at the breast.  Demonstrated breast compressions and positioned pillows for a more comfortable hold.  Mother appreciative of help.  Continue to feed 8-12 times/24 hours or sooner if baby shows cues.  Mother had colostrum container at bedside and I encouraged hand expression.  Mother will also continue using the DEBP after feedings to help stimulate milk supply and will feed back any EBM obtained to baby.  She will call for latch assistance as needed.  Observed baby feeding for 5 minutes well and she continued to feed after I left the room.  Father present and sleeping.   Maternal Data Formula Feeding for Exclusion: No Has patient been taught Hand Expression?: Yes Does the patient have breastfeeding experience prior to this delivery?: Yes  Feeding Feeding Type: Breast Fed  LATCH Score Latch: Grasps breast easily, tongue down, lips flanged, rhythmical sucking.  Audible Swallowing: A few with stimulation  Type of Nipple: Everted at rest and after stimulation  Comfort (Breast/Nipple): Soft / non-tender  Hold (Positioning): Assistance needed to correctly position infant at breast and maintain latch.  LATCH Score: 8  Interventions Interventions: Breast  feeding basics reviewed;Assisted with latch;Skin to skin;Breast massage;Breast compression;DEBP  Lactation Tools Discussed/Used Breast pump type: Double-Electric Breast Pump   Consult Status Consult Status: Follow-up Date: 10/15/18 Follow-up type: In-patient    Kimo Bancroft R Amiera Herzberg 10/14/2018, 8:06 AM

## 2018-10-14 NOTE — Clinical Social Work Maternal (Signed)
CLINICAL SOCIAL WORK MATERNAL/CHILD NOTE  Patient Details  Name: Alicia Small MRN: [id number] Date of Birth: 10/09/1990  Date:  05/12/19  Clinical Social Worker Initiating Note:  Ollen Barges Date/Time: Initiated:  10/14/18/0833     Child's Name:  Gardiner Rhyme   Biological Parents:  Mother, Father(Mayline Paschal and Di Jasmer DOB: 06/18/1992)   Need for Interpreter:  None   Reason for Referral:  Current Substance Use/Substance Use During Pregnancy    Address:  [address] [place] [address]    Phone number:  [phone] (home)     Additional phone number:   Household Members/Support Persons (HM/SP):   Household Member/Support Person 1, Household Member/Support Person 2   HM/SP Name Relationship DOB or Age  HM/SP -1 Jayra Choyce FOB 06/18/1992  HM/SP -2 Orlan Leavens Daughter 06/12/2016  HM/SP -3        HM/SP -4        HM/SP -5        HM/SP -6        HM/SP -7        HM/SP -8          Natural Supports (not living in the home):  Parent, Extended Family   Professional Supports:     Employment: Unemployed   Type of Work:     Education:  [place] arranged:    Museum/gallery curator Resources:  Medicaid   Other Resources:  [employer]   Cultural/Religious Considerations Which May Impact Care:    Strengths:  Ability to meet basic needs , Home prepared for child , Pediatrician chosen   Psychotropic Medications:         Pediatrician:    [place]  Pediatrician List:   [place] ([facility])  [facility]      Pediatrician Fax Number:    Risk Factors/Current Problems:  Substance Use    Cognitive State:  Able to Concentrate , Alert , Linear Thinking    Mood/Affect:  Bright , Calm , Comfortable , Interested , Relaxed    CSW Assessment: CSW received consult due to MOB's UDS coming back positive for THC.  CSW met with MOB to  offer support and complete assessment.    MOB sitting up in bed bonding with infant, as evidenced by doing skin-to-skin, FOB also present but was on his way out of the room to go to Registry. CSW introduced self and role and explained reason for consult to which MOB nodded in understanding. MOB pleasant, easy to engage and attentive to infant throughout assessment. MOB reported she and infant were doing well. Per MOB, she currently lives with FOB and their 69-year-old daughter in [place]. MOB reported her highest level of education as some college and stated she is not currently employed. MOB confirmed she receives [facility] and is aware of process to get infant added on to her plan. MOB denied having any mental health history or PPD with her first child but was still receptive to PMADs education. CSW provided education regarding the baby blues period vs. perinatal mood disorders, discussed treatment and gave resources for mental health follow up if concerns arise.  CSW recommends self-evaluation during the postpartum time period using the New Mom Checklist from Postpartum Progress and encouraged MOB to contact a medical professional if symptoms are noted at any time.  MOB denied any SI, HI or DV and reported  FOB, her mother and her grandmother as supports if she needs them.   CSW inquired about MOB's substance use history and MOB acknowledged a history of THC use. MOB stated last use was "months ago". CSW informed MOB that MOB's UDS on admission came back positive for THC to which MOB appeared surprised. CSW explained Hospital Drug Policy and informed MOB that UDS and CDS were still pending and that a CPS report would be made, if warranted. CSW explained process if a report were to be made. MOB denied any questions or concerns regarding policy.   MOB confirmed having all essential items for infant once discharged. MOB reported infant would be sleeping in a basinet once home. CSW provided review of Sudden  Infant Death Syndrome (SIDS) precautions and safe sleeping habits.  MOB denied any further questions, concerns or need for resources at this time.   CSW Plan/Description:  No Further Intervention Required/No Barriers to Discharge, Sudden Infant Death Syndrome (SIDS) Education, Perinatal Mood and Anxiety Disorder (PMADs) Education, Hospital Drug Screen Policy Information, CSW Will Continue to Monitor Umbilical Cord Tissue Drug Screen Results and Make Report if Warranted    Mattalynn Crandle  [place], LCSWA 10/14/2018, 9:19 AM 

## 2018-10-14 NOTE — Progress Notes (Signed)
Post Partum Day 1 Subjective: no complaints, up ad lib, voiding, tolerating PO and breastfeeding. Baby doing well at bedside.   Objective: Blood pressure 135/83, pulse 80, temperature 98.4 F (36.9 C), resp. rate 18, height 5\' 4"  (1.626 m), weight 89.7 kg, last menstrual period 01/22/2018, SpO2 100 %, unknown if currently breastfeeding.  Physical Exam:  General: alert and no distress Lochia: appropriate Uterine Fundus: firm DVT Evaluation: No evidence of DVT seen on physical exam. Negative Homan's sign. No cords or calf tenderness.  Recent Labs    10/13/18 1432 10/13/18 2328  HGB 12.9 12.0  HCT 38.6 36.5    Assessment/Plan: Plan for discharge tomorrow, Breastfeeding and Contraception Condoms Continue to monitor BP closely given GHTN.   LOS: 1 day   Alicia Small 10/14/2018, 8:25 AM

## 2018-10-15 MED ORDER — IBUPROFEN 600 MG PO TABS: 600.0000 mg | ORAL_TABLET | Freq: Four times a day (QID) | ORAL | 0 refills | Status: DC

## 2018-10-15 MED ORDER — ENALAPRIL MALEATE 5 MG PO TABS
5.0000 mg | ORAL_TABLET | Freq: Every day | ORAL | Status: DC
  Administered 2018-10-15: 5 mg via ORAL
  Filled 2018-10-15: qty 1

## 2018-10-15 MED ORDER — ENALAPRIL MALEATE 5 MG PO TABS: 5.0000 mg | ORAL_TABLET | Freq: Every day | ORAL | 1 refills | Status: DC

## 2018-10-15 NOTE — Lactation Note (Signed)
This note was copied from a baby's chart. Lactation Consultation Note  Patient Name: Alicia Small Date: 10/15/2018 Reason for consult: Follow-up assessment Baby is 34 hours old/6% weight loss.  Mom states baby is breastfeeding well.  She felt baby needed supplement so gave a small amount of formula this morning.  Discussed milk coming to volume and the prevention and treatment of engorgement.  Mom feels breasts are heavier.  She has a manual pump for home use.  Questions answered.  Reviewed lactation outpatient services and support.  Encouraged to call prn.  Maternal Data    Feeding Feeding Type: Formula Nipple Type: Slow - flow  LATCH Score                   Interventions    Lactation Tools Discussed/Used     Consult Status Consult Status: Complete Follow-up type: Call as needed    [name] 10/15/2018, 9:39 AM

## 2018-10-15 NOTE — Progress Notes (Signed)
Notified CNM Philipp Deputy about BP 137/101 @1013 , Vasotec 5 mg given. BP 139/ 98 @1155 . Pt does not have a headache, no blurred vision, no R upper gastric pain, no clonus, DTR +2. CNM Clelia Croft states pt may be discharged today, when infant is ready.

## 2018-10-15 NOTE — Discharge Instructions (Signed)

## 2018-11-17 ENCOUNTER — Encounter: Payer: Self-pay | Admitting: Advanced Practice Midwife

## 2018-11-17 ENCOUNTER — Ambulatory Visit (INDEPENDENT_AMBULATORY_CARE_PROVIDER_SITE_OTHER): Payer: Medicaid Other | Admitting: Advanced Practice Midwife

## 2018-11-17 ENCOUNTER — Other Ambulatory Visit: Payer: Self-pay

## 2018-11-17 NOTE — Progress Notes (Signed)
Alicia Small is a 28 y.o. who presents for a postpartum visit. She is 4 weeks postpartum following a spontaneous vaginal delivery. I have fully reviewed the prenatal and intrapartum course. The delivery was at 37.5 gestational weeks. IOL for GHTN Anesthesia: epidural. Postpartum course has been complicated by persistent elevated BPs, sent home on vasotec. Did not show for 1 week BP check. Stopped taking it a few days, monitored BP at home and it normalized. . Baby's course has been uneventful. Baby is feeding by bottle. Bleeding: no bleeding. Bowel function is normal. Bladder function is normal. Patient is sexually active. Contraception method is condoms. Postpartum depression screening: negative.   Current Outpatient Medications:  .  enalapril (VASOTEC) 5 MG tablet, Take 1 tablet (5 mg total) by mouth daily. (Patient not taking: Reported on 11/17/2018), Disp: 30 tablet, Rfl: 1 .  ibuprofen (ADVIL) 600 MG tablet, Take 1 tablet (600 mg total) by mouth every 6 (six) hours. (Patient not taking: Reported on 11/17/2018), Disp: 30 tablet, Rfl: 0 .  omeprazole (PRILOSEC) 20 MG capsule, Take 1 capsule (20 mg total) by mouth daily. (Patient not taking: Reported on 11/17/2018), Disp: 30 capsule, Rfl: 1 .  Prenatal Vit-Fe Fumarate-FA (PRENATAL MULTIVITAMIN) TABS tablet, Take 1 tablet by mouth daily at 12 noon., Disp: , Rfl:   Review of Systems   Constitutional: Negative for fever and chills Eyes: Negative for visual disturbances Respiratory: Negative for shortness of breath, dyspnea Cardiovascular: Negative for chest pain or palpitations  Gastrointestinal: Negative for vomiting, diarrhea and constipation Genitourinary: Negative for dysuria and urgency Musculoskeletal: Negative for back pain, joint pain, myalgias  Neurological: Negative for dizziness and headaches    Objective:     Vitals:   11/17/18 1021  BP: 117/81  Pulse: 79   General:  alert, cooperative and no distress   Breasts:  negative   Lungs: Normal respiratory effort  Heart:  regular rate and rhythm  Abdomen: Soft, nontender   Vulva:  normal  Vagina: normal vagina  Cervix:  closed  Corpus: Well involuted     Rectal Exam: no hemorrhoids        Assessment:    normal postpartum exam.  Plan:   1. Contraception: condoms 2. Follow up in:   as needed.

## 2020-09-27 ENCOUNTER — Emergency Department (HOSPITAL_COMMUNITY): Payer: 59

## 2020-09-27 ENCOUNTER — Ambulatory Visit (INDEPENDENT_AMBULATORY_CARE_PROVIDER_SITE_OTHER): Payer: 59 | Admitting: Nurse Practitioner

## 2020-09-27 ENCOUNTER — Encounter: Payer: Self-pay | Admitting: Nurse Practitioner

## 2020-09-27 ENCOUNTER — Other Ambulatory Visit: Payer: Self-pay

## 2020-09-27 ENCOUNTER — Emergency Department (HOSPITAL_COMMUNITY)
Admission: EM | Admit: 2020-09-27 | Discharge: 2020-09-28 | Disposition: A | Discharge status: Home / Self Care | Payer: 59 | Attending: Emergency Medicine | Admitting: Emergency Medicine

## 2020-09-27 DIAGNOSIS — Z79899 Other long term (current) drug therapy: Secondary | ICD-10-CM | POA: Insufficient documentation

## 2020-09-27 DIAGNOSIS — R0789 Other chest pain: Secondary | ICD-10-CM | POA: Insufficient documentation

## 2020-09-27 DIAGNOSIS — R000 Tachycardia, unspecified: Secondary | ICD-10-CM | POA: Diagnosis not present

## 2020-09-27 DIAGNOSIS — Z7689 Persons encountering health services in other specified circumstances: Secondary | ICD-10-CM

## 2020-09-27 DIAGNOSIS — I10 Essential (primary) hypertension: Secondary | ICD-10-CM | POA: Diagnosis not present

## 2020-09-27 DIAGNOSIS — R87619 Unspecified abnormal cytological findings in specimens from cervix uteri: Secondary | ICD-10-CM | POA: Diagnosis not present

## 2020-09-27 DIAGNOSIS — G43909 Migraine, unspecified, not intractable, without status migrainosus: Secondary | ICD-10-CM | POA: Insufficient documentation

## 2020-09-27 DIAGNOSIS — G43109 Migraine with aura, not intractable, without status migrainosus: Secondary | ICD-10-CM | POA: Diagnosis not present

## 2020-09-27 DIAGNOSIS — R079 Chest pain, unspecified: Secondary | ICD-10-CM | POA: Diagnosis not present

## 2020-09-27 LAB — HEPATIC FUNCTION PANEL
ALT: 14 U/L (ref 0–44)
AST: 17 U/L (ref 15–41)
Albumin: 4.5 g/dL (ref 3.5–5.0)
Alkaline Phosphatase: 72 U/L (ref 38–126)
Bilirubin, Direct: <0.1 mg/dL (ref 0.0–0.2)
Total Bilirubin: 0.5 mg/dL (ref 0.3–1.2)
Total Protein: 7.6 g/dL (ref 6.5–8.1)

## 2020-09-27 LAB — CBC
HCT: 41.6 % (ref 36.0–46.0)
Hemoglobin: 13.7 g/dL (ref 12.0–15.0)
MCH: 30.1 pg (ref 26.0–34.0)
MCHC: 32.9 g/dL (ref 30.0–36.0)
MCV: 91.4 fL (ref 80.0–100.0)
Platelets: 317 10*3/uL (ref 150–400)
RBC: 4.55 MIL/uL (ref 3.87–5.11)
RDW: 12.6 % (ref 11.5–15.5)
WBC: 12.3 10*3/uL — ABNORMAL HIGH (ref 4.0–10.5)
nRBC: 0 % (ref 0.0–0.2)

## 2020-09-27 LAB — BASIC METABOLIC PANEL
Anion gap: 11 (ref 5–15)
BUN: 14 mg/dL (ref 6–20)
CO2: 22 mmol/L (ref 22–32)
Calcium: 8.9 mg/dL (ref 8.9–10.3)
Chloride: 104 mmol/L (ref 98–111)
Creatinine, Ser: 0.85 mg/dL (ref 0.44–1.00)
GFR, Estimated: >60 mL/min (ref >60)
Glucose, Bld: 124 mg/dL — ABNORMAL HIGH (ref 70–99)
Potassium: 3.8 mmol/L (ref 3.5–5.1)
Sodium: 137 mmol/L (ref 135–145)

## 2020-09-27 LAB — TROPONIN I (HIGH SENSITIVITY)
Troponin I (High Sensitivity): <2 ng/L (ref <18)
Troponin I (High Sensitivity): <2 ng/L (ref <18)

## 2020-09-27 MED ORDER — BUTALBITAL-APAP-CAFFEINE 50-325-40 MG PO TABS: 1.0000 | ORAL_TABLET | Freq: Four times a day (QID) | ORAL | 0 refills | Status: DC | PRN

## 2020-09-27 MED ORDER — OLMESARTAN MEDOXOMIL 20 MG PO TABS: 10.0000 mg | ORAL_TABLET | Freq: Every day | ORAL | 1 refills | Status: DC

## 2020-09-27 NOTE — ED Provider Notes (Signed)
[facility] EMERGENCY DEPARTMENT Provider Note   CSN: [id number] Arrival date & time: 09/27/20  2117     History Chief Complaint  Patient presents with  . Hypertension  . Chest Pain    Alicia Small is a 30 y.o. female.  Patient presents with complaints of elevated blood pressure and chest pain.  Patient went to the dentist earlier today for scheduled feelings but her blood pressure was elevated and she did not get any dental work performed.  She was referred to her primary care doctor who did prescribe blood pressure medication but patient was experiencing chest discomfort at that time and she was referred to the emergency department.  Patient describes a tightness with an intermittent sharp pinching pain in the chest.  She reports that she has always had borderline high blood pressure but has never been treated.  No cardiac risk factors.  No PE risk factors.        Past Medical History:  Diagnosis Date  . Abnormal vaginal Pap smear   . Pregnancy induced hypertension   . Vaginal Pap smear, abnormal     Patient Active Problem List   Diagnosis Date Noted  . Essential hypertension, benign 09/27/2020  . Migraine 09/27/2020  . Chest pain 09/27/2020  . Encounter to establish care 03/18/2018  . Abnormal Pap smear of cervix 07/27/2013    Past Surgical History:  Procedure Laterality Date  . NO PAST SURGERIES       OB History    Gravida  2   Para  2   Term  2   Preterm      AB      Living  2     SAB      IAB      Ectopic      Multiple  0   Live Births  2           Family History  Problem Relation Age of Onset  . Hypertension Mother   . Diabetes Maternal Grandfather   . Cancer Maternal Grandfather        lung  . Stroke Maternal Grandfather   . Other Paternal Grandfather        polyps  . Hypertension Father   . Autism Brother   . Asthma Maternal Grandmother   . Thyroid disease Maternal Grandmother     Social History   Tobacco Use  .  Smoking status: Never Smoker  . Smokeless tobacco: Never Used  Vaping Use  . Vaping Use: Never used  Substance Use Topics  . Alcohol use: Not Currently    Comment: rarely; not now  . Drug use: No    Types: Marijuana    Comment: prior to pregnancy    Home Medications Prior to Admission medications   Medication Sig Start Date End Date Taking? Authorizing Provider  butalbital-acetaminophen-caffeine (FIORICET) 50-325-40 MG tablet Take 1 tablet by mouth every 6 (six) hours as needed for headache. 09/27/20   Heather Roberts, NP  olmesartan (BENICAR) 20 MG tablet Take 0.5 tablets (10 mg total) by mouth daily. 09/27/20   Heather Roberts, NP    Allergies    Penicillins  Review of Systems   Review of Systems  Respiratory: Positive for chest tightness.   Cardiovascular: Positive for chest pain.  All other systems reviewed and are negative.   Physical Exam Updated Vital Signs BP 132/82   Pulse 100   Temp 98.2 F (36.8 C)   Resp (!) 21  Ht 5\' 4"  (1.626 m)   Wt 72.1 kg   LMP 09/21/2020   SpO2 100%   BMI 27.28 kg/m   Physical Exam Vitals and nursing note reviewed.  Constitutional:      General: She is not in acute distress.    Appearance: Normal appearance. She is well-developed.  HENT:     Head: Normocephalic and atraumatic.     Right Ear: Hearing normal.     Left Ear: Hearing normal.     Nose: Nose normal.  Eyes:     Conjunctiva/sclera: Conjunctivae normal.     Pupils: Pupils are equal, round, and reactive to light.  Cardiovascular:     Rate and Rhythm: Regular rhythm. Tachycardia present.     Heart sounds: S1 normal and S2 normal. No murmur heard. No friction rub. No gallop.   Pulmonary:     Effort: Pulmonary effort is normal. No respiratory distress.     Breath sounds: Normal breath sounds.  Chest:     Chest wall: No tenderness.  Abdominal:     General: Bowel sounds are normal.     Palpations: Abdomen is soft.     Tenderness: There is no abdominal tenderness.  There is no guarding or rebound. Negative signs include Murphy's sign and McBurney's sign.     Hernia: No hernia is present.  Musculoskeletal:        General: Normal range of motion.     Cervical back: Normal range of motion and neck supple.  Skin:    General: Skin is warm and dry.     Findings: No rash.  Neurological:     Mental Status: She is alert and oriented to person, place, and time.     GCS: GCS eye subscore is 4. GCS verbal subscore is 5. GCS motor subscore is 6.     Cranial Nerves: No cranial nerve deficit.     Sensory: No sensory deficit.     Coordination: Coordination normal.  Psychiatric:        Speech: Speech normal.        Behavior: Behavior normal.        Thought Content: Thought content normal.     ED Results / Procedures / Treatments   Labs (all labs ordered are listed, but only abnormal results are displayed) Labs Reviewed  BASIC METABOLIC PANEL - Abnormal; Notable for the following components:      Result Value   Glucose, Bld 124 (*)    All other components within normal limits  CBC - Abnormal; Notable for the following components:   WBC 12.3 (*)    All other components within normal limits  HEPATIC FUNCTION PANEL  D-DIMER, QUANTITATIVE  POC URINE PREG, ED  TROPONIN I (HIGH SENSITIVITY)  TROPONIN I (HIGH SENSITIVITY)    EKG EKG Interpretation  Date/Time:  Tuesday September 27 2020 21:30:50 EDT Ventricular Rate:  96 PR Interval:  122 QRS Duration: 90 QT Interval:  358 QTC Calculation: 452 R Axis:   76 Text Interpretation: Normal sinus rhythm Nonspecific ST abnormality Abnormal ECG Confirmed by [birth year]1970 [phone]) on 09/27/2020 11:38:37 PM   Radiology DG Chest 2 View  Result Date: 09/27/2020 CLINICAL DATA:  Chest pain.  Elevated blood pressure. EXAM: CHEST - 2 VIEW COMPARISON:  None. FINDINGS: The cardiomediastinal contours are normal. The lungs are clear. Pulmonary vasculature is normal. No consolidation, pleural effusion, or  pneumothorax. No acute osseous abnormalities are seen. IMPRESSION: Negative radiographs of the chest. Electronically Signed   By: 09/29/2020  [place] M.D.   On: 09/27/2020 22:05    Procedures Procedures   Medications Ordered in ED Medications - No data to display  ED Course  I have reviewed the triage vital signs and the nursing notes.  Pertinent labs & imaging results that were available during my care of the patient were reviewed by me and considered in my medical decision making (see chart for details).    MDM Rules/Calculators/A&P                          Patient presents to the emergency department for evaluation of chest pain.  Patient has been experiencing elevated blood pressure recently.  She reports that she has had borderline blood pressure since her first pregnancy.  She had dental work a week ago and her blood pressure was borderline elevated at that time.  When she went back to the dentist today, blood pressure was elevated and she could not get any dental procedures performed.  She followed up with her primary doctor and was prescribed blood pressure medication but because of her chest pain was referred to the ER.  Symptoms seem atypical for cardiac etiology.  She has no specific cardiac risk factors.  Troponin is negative.  EKG is unremarkable other than tachycardia.  Doubt this is a cardiac etiology of her chest pain.  With her tachycardia, PE was considered.  Wells criteria score is 1.5 for her elevated heart rate, all other categories negative.  This is a very low risk.  As her D-dimer is negative, she does not require further work-up.  Final Clinical Impression(s) / ED Diagnoses Final diagnoses:  Primary hypertension  Atypical chest pain    Rx / DC Orders ED Discharge Orders    None       Larraine Argo, Canary Brim, MD 09/28/20 [phone]

## 2020-09-27 NOTE — ED Provider Notes (Signed)
MSE note.  Patient complains of some left-sided chest pain and feeling anxious and sweaty.  Physical exam patient in no acute distress but she is tachycardic.  Labs and x-rays ordered   Bethann Berkshire, MD 09/27/20 2214

## 2020-09-27 NOTE — Assessment & Plan Note (Signed)
-  EKG today is NSR with rate 93 -may be related to anxiety d/t HTN or migraine -if pain persists or gets worse, we discussed that she should get full cardiac workup at emergency department

## 2020-09-27 NOTE — Assessment & Plan Note (Signed)
-  Rx. fioricet -considered triptans, but she is having chest pain as well; will wait on those ntil sure there is no cardiac etiology for her CP

## 2020-09-27 NOTE — ED Triage Notes (Signed)
Pt c/o HTN and chest pain. Pt was seen at PCP after dentist sent her there due to her BP being high. Pt states that she was prescribed something for her BP but it hasn't helped since she took it today.

## 2020-09-27 NOTE — Progress Notes (Addendum)
New Patient Office Visit  Subjective:  Patient ID: Alicia Small, female    DOB: 1991/05/30  Age: 30 y.o. MRN: [id number]  CC:  Chief Complaint  Patient presents with  . New Patient (Initial Visit)  . Hypertension    X 3 days     HPI Alicia Small presents for new patient visit. No recent PCP Last physical was over 2 years ago. Last labs were drawn over 2 years ago.  She states that she was at dentist office, and her BP was so high that the dentist would not proceed with the scheduled visit.  She states that she has migraines and has had them for the past 3 days.  She states that her vision in her right eye is decreased.  Her pain is in both sides of her head, and she states that she sees a white circle and then she loses peripheral vision. She has to drink water and relax.  She states that she has had increased sweating and has chest pain.  She describes the sweat as a hot sweat.  Her chest pain is a 5-6/10. She states that she has SOB and feels like she has to take a deep breath. Her pain is worse in her mid-chest and left breast. She states it feels like someone is punching her in her chest/breast.  No current GYN, but she states she has ovarian cysts. No GYN visits since pregnancy.     Past Medical History:  Diagnosis Date  . Abnormal vaginal Pap smear   . Pregnancy induced hypertension   . Vaginal Pap smear, abnormal     Past Surgical History:  Procedure Laterality Date  . NO PAST SURGERIES      Family History  Problem Relation Age of Onset  . Hypertension Mother   . Diabetes Maternal Grandfather   . Cancer Maternal Grandfather        lung  . Stroke Maternal Grandfather   . Other Paternal Grandfather        polyps  . Hypertension Father   . Autism Brother   . Asthma Maternal Grandmother   . Thyroid disease Maternal Grandmother     Social History   Socioeconomic History  . Marital status: Married    Spouse name: Darnell Stimson   . Number  of children: 2  . Years of education: 52  . Highest education level: High school graduate  Occupational History  . Occupation: unemployed  Tobacco Use  . Smoking status: Never Smoker  . Smokeless tobacco: Never Used  Vaping Use  . Vaping Use: Never used  Substance and Sexual Activity  . Alcohol use: Not Currently    Comment: rarely; not now  . Drug use: No    Types: Marijuana    Comment: prior to pregnancy  . Sexual activity: Yes    Birth control/protection: Condom  Other Topics Concern  . Not on file  Social History Narrative  . Not on file   Social Determinants of Health   Financial Resource Strain: Not on file  Food Insecurity: Not on file  Transportation Needs: Not on file  Physical Activity: Not on file  Stress: Not on file  Social Connections: Not on file  Intimate Partner Violence: Not on file    ROS Review of Systems  Constitutional: Negative.        Increased sweating  Respiratory: Positive for shortness of breath. Negative for cough, chest tightness and wheezing.   Cardiovascular: Positive for chest pain. Negative for  palpitations and leg swelling.  Neurological: Positive for headaches.  Psychiatric/Behavioral: Negative.     Objective:   Today's Vitals: BP (!) 168/96   Pulse (!) 128   Temp 99.3 F (37.4 C)   Resp 20   Ht '5\' 4"'  (1.626 m)   Wt 159 lb (72.1 kg)   SpO2 98%   BMI 27.29 kg/m   Physical Exam Constitutional:      Appearance: Normal appearance.  Cardiovascular:     Rate and Rhythm: Normal rate and regular rhythm.     Pulses: Normal pulses.     Heart sounds: Normal heart sounds.  Pulmonary:     Effort: Pulmonary effort is normal.     Breath sounds: Normal breath sounds.  Musculoskeletal:        General: Normal range of motion.  Skin:    Findings: Rash present.     Comments: Maculopapular rash from waistline to bra  Neurological:     Mental Status: She is alert.  Psychiatric:        Mood and Affect: Mood normal.         Behavior: Behavior normal.        Thought Content: Thought content normal.        Judgment: Judgment normal.     Assessment & Plan:   Problem List Items Addressed This Visit      Cardiovascular and Mediastinum   Essential hypertension, benign    BP Readings from Last 3 Encounters:  09/27/20 (!) 168/96  11/17/18 117/81  10/15/18 (!) 139/98   -BP elevated today; may have some pain from migraine that is elevating BP -Rx. olmesartan      Relevant Medications   olmesartan (BENICAR) 20 MG tablet   Other Relevant Orders   CBC with Differential/Platelet   CMP14+EGFR   TSH   Migraine    -Rx. fioricet -considered triptans, but she is having chest pain as well; will wait on those ntil sure there is no cardiac etiology for her CP      Relevant Medications   olmesartan (BENICAR) 20 MG tablet   butalbital-acetaminophen-caffeine (FIORICET) 50-325-40 MG tablet     Other   Abnormal Pap smear of cervix    -no recent PAP; we discussed GYN referral, but she declines PAP and/or GYN referral at this time      Encounter to establish care    -no recent PCP, so no records to obtain      Relevant Orders   CBC with Differential/Platelet   CMP14+EGFR   Lipid Panel With LDL/HDL Ratio   TSH   Chest pain    -EKG today is NSR with rate 93 -may be related to anxiety d/t HTN or migraine -if pain persists or gets worse, we discussed that she should get full cardiac workup at emergency department      Relevant Orders   TSH      Outpatient Encounter Medications as of 09/27/2020  Medication Sig  . butalbital-acetaminophen-caffeine (FIORICET) 50-325-40 MG tablet Take 1 tablet by mouth every 6 (six) hours as needed for headache.  . olmesartan (BENICAR) 20 MG tablet Take 0.5 tablets (10 mg total) by mouth daily.  . [DISCONTINUED] enalapril (VASOTEC) 5 MG tablet Take 1 tablet (5 mg total) by mouth daily. (Patient not taking: No sig reported)  . [DISCONTINUED] ibuprofen (ADVIL) 600 MG tablet  Take 1 tablet (600 mg total) by mouth every 6 (six) hours. (Patient not taking: No sig reported)  . [DISCONTINUED] omeprazole (PRILOSEC) 20 MG  capsule Take 1 capsule (20 mg total) by mouth daily. (Patient not taking: No sig reported)  . [DISCONTINUED] Prenatal Vit-Fe Fumarate-FA (PRENATAL MULTIVITAMIN) TABS tablet Take 1 tablet by mouth daily at 12 noon. (Patient not taking: Reported on 09/27/2020)   No facility-administered encounter medications on file as of 09/27/2020.    Follow-up: Return in about 2 weeks (around 10/11/2020) for Physical Exam.   Noreene Larsson, NP

## 2020-09-27 NOTE — Assessment & Plan Note (Signed)
-  no recent PCP, so no records to obtain

## 2020-09-27 NOTE — Assessment & Plan Note (Signed)
BP Readings from Last 3 Encounters:  09/27/20 (!) 168/96  11/17/18 117/81  10/15/18 (!) 139/98   -BP elevated today; may have some pain from migraine that is elevating BP -Rx. olmesartan

## 2020-09-27 NOTE — Assessment & Plan Note (Signed)
-  no recent PAP; we discussed GYN referral, but she declines PAP and/or GYN referral at this time

## 2020-09-27 NOTE — Addendum Note (Signed)
Addended by: Bjorn Pippin on: 09/27/2020 04:02 PM   Modules accepted: Orders

## 2020-09-27 NOTE — Patient Instructions (Signed)
Please have fasting labs drawn 2-3 days prior to your appointment so we can discuss the results during your office visit.  

## 2020-09-28 ENCOUNTER — Ambulatory Visit: Payer: Medicaid Other | Admitting: Nurse Practitioner

## 2020-09-28 LAB — D-DIMER, QUANTITATIVE: D-Dimer, Quant: 0.3 ug/mL-FEU (ref 0.00–0.50)

## 2020-09-29 ENCOUNTER — Telehealth: Payer: Self-pay | Admitting: *Deleted

## 2020-09-29 ENCOUNTER — Telehealth: Payer: Self-pay

## 2020-09-29 ENCOUNTER — Other Ambulatory Visit: Payer: Self-pay | Admitting: Nurse Practitioner

## 2020-09-29 DIAGNOSIS — R079 Chest pain, unspecified: Secondary | ICD-10-CM

## 2020-09-29 NOTE — Telephone Encounter (Signed)
Pt informed

## 2020-09-29 NOTE — Telephone Encounter (Signed)
Pt called and would like to go ahead and get a referral to cardiology to [facility]. States the pinching chest and sweating is still going on.

## 2020-09-29 NOTE — Telephone Encounter (Signed)
Sent in referral

## 2020-09-29 NOTE — Telephone Encounter (Signed)
Transition Care Management Follow-up Telephone Call  Date of discharge and from where: 09/28/2020 - [facility]  How have you been since you were released from the hospital? "Okay"  Any questions or concerns? No  Items Reviewed:  Did the pt receive and understand the discharge instructions provided? Yes   Medications obtained and verified? Yes   Other? No   Any new allergies since your discharge? No   Dietary orders reviewed? No  Do you have support at home? Yes    Functional Questionnaire: (I = Independent and D = Dependent) ADLs: I  Bathing/Dressing- I  Meal Prep- I  Eating- I  Maintaining continence- I  Transferring/Ambulation- I  Managing Meds- I  Follow up appointments reviewed:   PCP Hospital f/u appt confirmed? Yes  Scheduled to see Bjorn Pippin, NP on 10/13/2020 @ 0920.  Specialist Hospital f/u appt confirmed? No    Are transportation arrangements needed? No   If their condition worsens, is the pt aware to call PCP or go to the Emergency Dept.? Yes  Was the patient provided with contact information for the PCP's office or ED? Yes  Was to pt encouraged to call back with questions or concerns? Yes

## 2020-09-30 DIAGNOSIS — R079 Chest pain, unspecified: Secondary | ICD-10-CM | POA: Diagnosis not present

## 2020-09-30 DIAGNOSIS — Z880 Allergy status to penicillin: Secondary | ICD-10-CM | POA: Diagnosis not present

## 2020-09-30 DIAGNOSIS — R000 Tachycardia, unspecified: Secondary | ICD-10-CM | POA: Diagnosis not present

## 2020-09-30 DIAGNOSIS — R9431 Abnormal electrocardiogram [ECG] [EKG]: Secondary | ICD-10-CM | POA: Diagnosis not present

## 2020-10-03 ENCOUNTER — Encounter: Payer: Self-pay | Admitting: Nurse Practitioner

## 2020-10-03 ENCOUNTER — Telehealth: Payer: Self-pay

## 2020-10-03 ENCOUNTER — Other Ambulatory Visit: Payer: Self-pay

## 2020-10-03 ENCOUNTER — Ambulatory Visit (INDEPENDENT_AMBULATORY_CARE_PROVIDER_SITE_OTHER): Payer: 59 | Admitting: Nurse Practitioner

## 2020-10-03 DIAGNOSIS — N644 Mastodynia: Secondary | ICD-10-CM

## 2020-10-03 DIAGNOSIS — N6320 Unspecified lump in the left breast, unspecified quadrant: Secondary | ICD-10-CM | POA: Diagnosis not present

## 2020-10-03 NOTE — Progress Notes (Signed)
Established Patient Office Visit  Subjective:  Patient ID: Alicia Small, female    DOB: Feb 24, 1991  Age: 30 y.o. MRN: [id number]  CC:  Chief Complaint  Patient presents with  . Chest Pain    Pt went to [facility] on Friday, CT of chest and lungs were done. Hospital recommend she have a mammogram for darkened area on CT, suspected on breast.     HPI Alicia Small presents for ED follow-up. She went to [facility] on 09/30/20 for chest pain. CT was negative for PE.  EKG showed sinus tach. Troponins negative.  She is having left-sided chest pain x 1 week. She states that her pain radiates into her clavicle.  She feels a mass.  She states the mass she feels is the size of a golf ball.  She has pain when she touches the mass.  Past Medical History:  Diagnosis Date  . Abnormal vaginal Pap smear   . Pregnancy induced hypertension   . Vaginal Pap smear, abnormal     Past Surgical History:  Procedure Laterality Date  . NO PAST SURGERIES      Family History  Problem Relation Age of Onset  . Hypertension Mother   . Diabetes Maternal Grandfather   . Cancer Maternal Grandfather        lung  . Stroke Maternal Grandfather   . Other Paternal Grandfather        polyps  . Hypertension Father   . Autism Brother   . Asthma Maternal Grandmother   . Thyroid disease Maternal Grandmother     Social History   Socioeconomic History  . Marital status: Married    Spouse name: Hailey Stormer   . Number of children: 2  . Years of education: 66  . Highest education level: High school graduate  Occupational History  . Occupation: unemployed  Tobacco Use  . Smoking status: Never Smoker  . Smokeless tobacco: Never Used  Vaping Use  . Vaping Use: Never used  Substance and Sexual Activity  . Alcohol use: Not Currently    Comment: rarely; not now  . Drug use: No    Types: Marijuana    Comment: prior to pregnancy  . Sexual activity: Yes    Birth control/protection: Condom  Other Topics  Concern  . Not on file  Social History Narrative  . Not on file   Social Determinants of Health   Financial Resource Strain: Not on file  Food Insecurity: Not on file  Transportation Needs: Not on file  Physical Activity: Not on file  Stress: Not on file  Social Connections: Not on file  Intimate Partner Violence: Not on file    Outpatient Medications Prior to Visit  Medication Sig Dispense Refill  . butalbital-acetaminophen-caffeine (FIORICET) 50-325-40 MG tablet Take 1 tablet by mouth every 6 (six) hours as needed for headache. 14 tablet 0  . olmesartan (BENICAR) 20 MG tablet Take 0.5 tablets (10 mg total) by mouth daily. 15 tablet 1   No facility-administered medications prior to visit.    Allergies  Allergen Reactions  . Penicillins Hives    Has patient had a PCN reaction causing immediate rash, facial/tongue/throat swelling, SOB or lightheadedness with hypotension: no Has patient had a PCN reaction causing severe rash involving mucus membranes or skin necrosis:yes Has patient had a PCN reaction that required hospitalizationno Has patient had a PCN reaction occurring within the last 10 years no If all of the above answers are "NO", then may proceed with Cephalosporin use.  ROS Review of Systems  Constitutional: Negative.   Respiratory: Negative.   Cardiovascular:       Left chest/breast pain and palpable mass      Objective:    Physical Exam  LMP 09/21/2020  Wt Readings from Last 3 Encounters:  09/27/20 158 lb 15.2 oz (72.1 kg)  09/27/20 159 lb (72.1 kg)  11/17/18 185 lb 6.4 oz (84.1 kg)     Health Maintenance Due  Topic Date Due  . PAP SMEAR-Modifier  12/19/2018    There are no preventive care reminders to display for this patient.  No results found for: TSH Lab Results  Component Value Date   WBC 12.3 (H) 09/27/2020   HGB 13.7 09/27/2020   HCT 41.6 09/27/2020   MCV 91.4 09/27/2020   PLT 317 09/27/2020   Lab Results  Component Value  Date   NA 137 09/27/2020   K 3.8 09/27/2020   CO2 22 09/27/2020   GLUCOSE 124 (H) 09/27/2020   BUN 14 09/27/2020   CREATININE 0.85 09/27/2020   BILITOT 0.5 09/27/2020   ALKPHOS 72 09/27/2020   AST 17 09/27/2020   ALT 14 09/27/2020   PROT 7.6 09/27/2020   ALBUMIN 4.5 09/27/2020   CALCIUM 8.9 09/27/2020   ANIONGAP 11 09/27/2020   No results found for: CHOL No results found for: HDL No results found for: LDLCALC No results found for: TRIG No results found for: CHOLHDL No results found for: YKZL9J    Assessment & Plan:   Problem List Items Addressed This Visit      Other   Breast pain, left    -left breast/chest pain with palpable mass -no known mechanism of injurt -she went to ED at [facility] on 09/30/20 and was negative for PE or cardiac issues; radiology report was negative, but she states ED physician said that there was some darkening on imaging and that she should get mammogram -ordered mammogram and U/S since she has mass she can palpate       Other Visit Diagnoses    Left breast mass    -  Primary   Relevant Orders   MM Digital Diagnostic Bilat   US BREAST LTD UNI LEFT INC AXILLA      No orders of the defined types were placed in this encounter.  Date:  10/03/2020   Location of Patient: Home Location of Provider: Office Consent was obtain for visit to be over via telehealth. I verified that I am speaking with the correct person using two identifiers.  I connected with  Alicia Small on 10/03/20 via telephone and verified that I am speaking with the correct person using two identifiers.   I discussed the limitations of evaluation and management by telemedicine. The patient expressed understanding and agreed to proceed.  Time spent: 15 minutes   Follow-up: Return if symptoms worsen or fail to improve.    Heather Roberts, NP

## 2020-10-03 NOTE — Assessment & Plan Note (Signed)
-  left breast/chest pain with palpable mass -no known mechanism of injurt -she went to ED at [facility] on 09/30/20 and was negative for PE or cardiac issues; radiology report was negative, but she states ED physician said that there was some darkening on imaging and that she should get mammogram -ordered mammogram and U/S since she has mass she can palpate

## 2020-10-03 NOTE — Telephone Encounter (Signed)
Transition Care Management Follow-up Telephone Call  Date of discharge and from where: 09/30/2020 from [facility]  How have you been since you were released from the hospital? Pt states that she is feeling well today.   Any questions or concerns? No  Items Reviewed:  Did the pt receive and understand the discharge instructions provided? Yes   Medications obtained and verified? Yes   Other? No   Any new allergies since your discharge? No   Dietary orders reviewed? n/a  Do you have support at home? Yes   Functional Questionnaire: (I = Independent and D = Dependent) ADLs: I  Bathing/Dressing- I  Meal Prep- I  Eating- I  Maintaining continence- I  Transferring/Ambulation- I  Managing Meds- I  Follow up appointments reviewed:   PCP Hospital f/u appt confirmed? Yes  Scheduled to see Bjorn Pippin, NP on 10/03/2020 @ 09:20am.  Specialist Hospital f/u appt confirmed? No    Are transportation arrangements needed? No   If their condition worsens, is the pt aware to call PCP or go to the Emergency Dept.? Yes  Was the patient provided with contact information for the PCP's office or ED? Yes  Was to pt encouraged to call back with questions or concerns? Yes

## 2020-10-04 ENCOUNTER — Other Ambulatory Visit: Payer: Self-pay

## 2020-10-04 DIAGNOSIS — N644 Mastodynia: Secondary | ICD-10-CM

## 2020-10-13 ENCOUNTER — Encounter: Payer: 59 | Admitting: Nurse Practitioner

## 2020-10-18 ENCOUNTER — Encounter: Payer: 59 | Admitting: Nurse Practitioner

## 2020-10-18 ENCOUNTER — Ambulatory Visit (HOSPITAL_COMMUNITY)
Admission: RE | Admit: 2020-10-18 | Discharge: 2020-10-18 | Disposition: A | Discharge status: Home / Self Care | Payer: 59 | Source: Ambulatory Visit | Attending: Nurse Practitioner | Admitting: Nurse Practitioner

## 2020-10-18 ENCOUNTER — Other Ambulatory Visit: Payer: Self-pay

## 2020-10-18 DIAGNOSIS — N644 Mastodynia: Secondary | ICD-10-CM

## 2020-10-18 DIAGNOSIS — R922 Inconclusive mammogram: Secondary | ICD-10-CM | POA: Diagnosis not present

## 2020-10-18 DIAGNOSIS — N6320 Unspecified lump in the left breast, unspecified quadrant: Secondary | ICD-10-CM

## 2020-10-18 LAB — CBC WITH DIFFERENTIAL/PLATELET
Basophils Absolute: 0.1 10*3/uL (ref 0.0–0.2)
Basos: 1 %
EOS (ABSOLUTE): 0.2 10*3/uL (ref 0.0–0.4)
Eos: 2 %
Hematocrit: 40.8 % (ref 34.0–46.6)
Hemoglobin: 13.8 g/dL (ref 11.1–15.9)
Immature Grans (Abs): 0 10*3/uL (ref 0.0–0.1)
Immature Granulocytes: 0 %
Lymphocytes Absolute: 2 10*3/uL (ref 0.7–3.1)
Lymphs: 25 %
MCH: 29.6 pg (ref 26.6–33.0)
MCHC: 33.8 g/dL (ref 31.5–35.7)
MCV: 87 fL (ref 79–97)
Monocytes Absolute: 0.6 10*3/uL (ref 0.1–0.9)
Monocytes: 8 %
Neutrophils Absolute: 4.9 10*3/uL (ref 1.4–7.0)
Neutrophils: 64 %
Platelets: 295 10*3/uL (ref 150–450)
RBC: 4.67 x10E6/uL (ref 3.77–5.28)
RDW: 12.1 % (ref 11.7–15.4)
WBC: 7.8 10*3/uL (ref 3.4–10.8)

## 2020-10-18 LAB — TSH: TSH: 1.61 u[IU]/mL (ref 0.450–4.500)

## 2020-10-18 LAB — LIPID PANEL WITH LDL/HDL RATIO
Cholesterol, Total: 156 mg/dL (ref 100–199)
HDL: 56 mg/dL (ref >39)
LDL Chol Calc (NIH): 91 mg/dL (ref 0–99)
LDL/HDL Ratio: 1.6 ratio (ref 0.0–3.2)
Triglycerides: 43 mg/dL (ref 0–149)
VLDL Cholesterol Cal: 9 mg/dL (ref 5–40)

## 2020-10-18 LAB — CMP14+EGFR
ALT: 11 IU/L (ref 0–32)
AST: 11 IU/L (ref 0–40)
Albumin/Globulin Ratio: 1.9 (ref 1.2–2.2)
Albumin: 4.4 g/dL (ref 3.9–5.0)
Alkaline Phosphatase: 95 IU/L (ref 44–121)
BUN/Creatinine Ratio: 11 (ref 9–23)
BUN: 10 mg/dL (ref 6–20)
Bilirubin Total: 0.3 mg/dL (ref 0.0–1.2)
CO2: 23 mmol/L (ref 20–29)
Calcium: 9.1 mg/dL (ref 8.7–10.2)
Chloride: 103 mmol/L (ref 96–106)
Creatinine, Ser: 0.91 mg/dL (ref 0.57–1.00)
Globulin, Total: 2.3 g/dL (ref 1.5–4.5)
Glucose: 106 mg/dL — ABNORMAL HIGH (ref 65–99)
Potassium: 4.1 mmol/L (ref 3.5–5.2)
Sodium: 140 mmol/L (ref 134–144)
Total Protein: 6.7 g/dL (ref 6.0–8.5)
eGFR: 87 mL/min/{1.73_m2} (ref >59)

## 2020-10-24 NOTE — Progress Notes (Signed)
Labs look great.

## 2020-10-25 ENCOUNTER — Encounter: Payer: 59 | Admitting: Nurse Practitioner

## 2020-11-16 ENCOUNTER — Other Ambulatory Visit: Payer: Self-pay

## 2020-11-16 ENCOUNTER — Ambulatory Visit (INDEPENDENT_AMBULATORY_CARE_PROVIDER_SITE_OTHER): Payer: 59 | Admitting: Nurse Practitioner

## 2020-11-16 ENCOUNTER — Encounter: Payer: Self-pay | Admitting: Nurse Practitioner

## 2020-11-16 VITALS — BP 121/79 | HR 102 | Temp 99.1°F | Resp 20 | Ht 64.0 in | Wt 166.0 lb

## 2020-11-16 DIAGNOSIS — R7301 Impaired fasting glucose: Secondary | ICD-10-CM | POA: Diagnosis not present

## 2020-11-16 DIAGNOSIS — Z0001 Encounter for general adult medical examination with abnormal findings: Secondary | ICD-10-CM | POA: Diagnosis not present

## 2020-11-16 NOTE — Progress Notes (Signed)
Established Patient Office Visit  Subjective:  Patient ID: Alicia Small, female    DOB: 1991/05/01  Age: 30 y.o. MRN: [id number]  CC:  Chief Complaint  Patient presents with   Annual Exam    HPI Alicia Small presents for physical exam.  At her last OV, she had palpable breast mass, so she was sent for diagnostic mammogram and u/s, and the results were "1. No mammographic or sonographic abnormalities in the region of pain in the inner left breast. 2. No mammographic evidence of malignancy in either breast."    Past Medical History:  Diagnosis Date   Abnormal vaginal Pap smear    Hypertension    [employer]reesia 10/10/2020   Pregnancy induced hypertension    Vaginal Pap smear, abnormal     Past Surgical History:  Procedure Laterality Date   NO PAST SURGERIES      Family History  Problem Relation Age of Onset   Hypertension Mother    Diabetes Maternal Grandfather    Cancer Maternal Grandfather        lung   Stroke Maternal Grandfather    Other Paternal Grandfather        polyps   Hypertension Father    Autism Brother    Asthma Maternal Grandmother    Thyroid disease Maternal Grandmother     Social History   Socioeconomic History   Marital status: Married    Spouse name: Chrystie Hagwood    Number of children: 2   Years of education: 14   Highest education level: High school graduate  Occupational History   Occupation: unemployed  Tobacco Use   Smoking status: Never   Smokeless tobacco: Never  Vaping Use   Vaping Use: Never used  Substance and Sexual Activity   Alcohol use: Not Currently    Comment: rarely; not now   Drug use: No    Types: Marijuana    Comment: prior to pregnancy   Sexual activity: Yes    Birth control/protection: Condom  Other Topics Concern   Not on file  Social History Narrative   Not on file   Social Determinants of Health   Financial Resource Strain: Not on file  Food Insecurity: Not on file  Transportation Needs: Not  on file  Physical Activity: Not on file  Stress: Not on file  Social Connections: Not on file  Intimate Partner Violence: Not on file    Outpatient Medications Prior to Visit  Medication Sig Dispense Refill   butalbital-acetaminophen-caffeine (FIORICET) 50-325-40 MG tablet Take 1 tablet by mouth every 6 (six) hours as needed for headache. 14 tablet 0   olmesartan (BENICAR) 20 MG tablet Take 0.5 tablets (10 mg total) by mouth daily. 15 tablet 1   No facility-administered medications prior to visit.    Allergies  Allergen Reactions   Penicillins Hives    Has patient had a PCN reaction causing immediate rash, facial/tongue/throat swelling, SOB or lightheadedness with hypotension: no Has patient had a PCN reaction causing severe rash involving mucus membranes or skin necrosis:yes Has patient had a PCN reaction that required hospitalizationno Has patient had a PCN reaction occurring within the last 10 years no If all of the above answers are "NO", then may proceed with Cephalosporin use.     ROS Review of Systems  Constitutional: Negative.   HENT: Negative.    Eyes: Negative.   Respiratory: Negative.    Cardiovascular: Negative.   Gastrointestinal: Negative.   Endocrine: Negative.   Genitourinary: Negative.  Musculoskeletal: Negative.   Skin: Negative.   Allergic/Immunologic: Negative.   Neurological: Negative.   Hematological: Negative.   Psychiatric/Behavioral: Negative.       Objective:    Physical Exam Constitutional:      Appearance: Normal appearance.  HENT:     Head: Normocephalic and atraumatic.     Right Ear: Tympanic membrane, ear canal and external ear normal.     Left Ear: Tympanic membrane, ear canal and external ear normal.     Nose: Nose normal.     Mouth/Throat:     Mouth: Mucous membranes are dry.     Pharynx: Oropharynx is clear.  Eyes:     Extraocular Movements: Extraocular movements intact.     Conjunctiva/sclera: Conjunctivae normal.      Pupils: Pupils are equal, round, and reactive to light.  Cardiovascular:     Rate and Rhythm: Normal rate and regular rhythm.     Pulses: Normal pulses.     Heart sounds: Normal heart sounds.  Pulmonary:     Effort: Pulmonary effort is normal.     Breath sounds: Normal breath sounds.  Abdominal:     General: Abdomen is flat. Bowel sounds are normal.     Palpations: Abdomen is soft.  Musculoskeletal:        General: Normal range of motion.     Cervical back: Normal range of motion and neck supple.  Skin:    General: Skin is warm and dry.     Capillary Refill: Capillary refill takes less than 2 seconds.  Neurological:     General: No focal deficit present.     Mental Status: She is alert and oriented to person, place, and time.     Cranial Nerves: No cranial nerve deficit.     Sensory: No sensory deficit.     Motor: No weakness.     Coordination: Coordination normal.     Gait: Gait normal.  Psychiatric:        Mood and Affect: Mood normal.        Behavior: Behavior normal.        Thought Content: Thought content normal.        Judgment: Judgment normal.    BP 121/79 (BP Location: Right Arm, Patient Position: Sitting, Cuff Size: Large)   Pulse (!) 102   Temp 99.1 F (37.3 C)   Resp 20   Ht '5\' 4"'  (1.626 m)   Wt 166 lb (75.3 kg)   SpO2 98%   BMI 28.49 kg/m  Wt Readings from Last 3 Encounters:  11/16/20 166 lb (75.3 kg)  09/27/20 158 lb 15.2 oz (72.1 kg)  09/27/20 159 lb (72.1 kg)     Health Maintenance Due  Topic Date Due   PAP SMEAR-Modifier  12/19/2018    There are no preventive care reminders to display for this patient.  Lab Results  Component Value Date   TSH 1.610 10/17/2020   Lab Results  Component Value Date   WBC 7.8 10/17/2020   HGB 13.8 10/17/2020   HCT 40.8 10/17/2020   MCV 87 10/17/2020   PLT 295 10/17/2020   Lab Results  Component Value Date   NA 140 10/17/2020   K 4.1 10/17/2020   CO2 23 10/17/2020   GLUCOSE 106 (H) 10/17/2020   BUN  10 10/17/2020   CREATININE 0.91 10/17/2020   BILITOT 0.3 10/17/2020   ALKPHOS 95 10/17/2020   AST 11 10/17/2020   ALT 11 10/17/2020   PROT 6.7 10/17/2020  ALBUMIN 4.4 10/17/2020   CALCIUM 9.1 10/17/2020   ANIONGAP 11 09/27/2020   EGFR 87 10/17/2020   Lab Results  Component Value Date   CHOL 156 10/17/2020   Lab Results  Component Value Date   HDL 56 10/17/2020   Lab Results  Component Value Date   LDLCALC 91 10/17/2020   Lab Results  Component Value Date   TRIG 43 10/17/2020   No results found for: CHOLHDL No results found for: HGBA1C    Assessment & Plan:   Problem List Items Addressed This Visit       Endocrine   IFG (impaired fasting glucose)    -will check A1c with next set of labs       Relevant Orders   Hemoglobin A1c     Other   Encounter for general adult medical examination with abnormal findings - Primary    -elevated fasting glucose; will check A1c with next set of labs -due for PAP; will get that with next appointment       Relevant Orders   CBC with Differential/Platelet   CMP14+EGFR   Lipid Panel With LDL/HDL Ratio   Hemoglobin A1c    No orders of the defined types were placed in this encounter.   Follow-up: Return in about 6 months (around 05/18/2021) for Lab follow-up (IFG) with PAP.    Noreene Larsson, NP

## 2020-11-16 NOTE — Assessment & Plan Note (Signed)
-  will check A1c with next set of labs 

## 2020-11-16 NOTE — Patient Instructions (Signed)
Please have fasting labs drawn 2-3 days prior to your appointment so we can discuss the results during your office visit.  We will complete a PAP screening for cervical cancer at the next appointment.

## 2020-11-16 NOTE — Assessment & Plan Note (Addendum)
-  elevated fasting glucose; will check A1c with next set of labs -due for PAP; will get that with next appointment

## 2020-12-06 ENCOUNTER — Ambulatory Visit: Payer: 59 | Admitting: Interventional Cardiology

## 2021-03-30 ENCOUNTER — Ambulatory Visit (INDEPENDENT_AMBULATORY_CARE_PROVIDER_SITE_OTHER): Payer: 59 | Admitting: Orthopaedic Surgery

## 2021-03-30 ENCOUNTER — Other Ambulatory Visit: Payer: Self-pay

## 2021-03-30 DIAGNOSIS — R200 Anesthesia of skin: Secondary | ICD-10-CM

## 2021-03-30 NOTE — Progress Notes (Signed)
Office Visit Note   Patient: Alicia Small           Date of Birth: May 03, 1991           MRN: [id number] Visit Date: 03/30/2021              Requested by: Heather Roberts, NP [address]  [address] [place],  [place] [address] PCP: Heather Roberts, NP   Assessment & Plan: Visit Diagnoses:  1. Bilateral hand numbness     Plan: Patient likely has bilateral carpal tunnel syndrome worse on left than right.  Been present for several years but worse recently.  Will place in wrist splints and recheck her in 3 weeks.  If splinting is not effective she will require electrical test to rule out carpal tunnel syndrome.  Follow-Up Instructions: No follow-ups on file.   Orders:  No orders of the defined types were placed in this encounter.  No orders of the defined types were placed in this encounter.     Procedures: No procedures performed   Clinical Data: No additional findings.   Subjective: Chief Complaint  Patient presents with   Other    Hand pain    HPI 30 year old female with bilateral hand numbness worse in the left hand than right dominant hand.  Patient has a 38-year-old and 78-year-old daughter has to lift her children some.  She works at [employer] needs and has been using a pressure washer type device for some cleaning which is making her symptoms worse.  She is used Tylenol arthritis concerning about taking too much.  Pain wakes her up at night she has to shake her hands and then fall back asleep.  She has been using copper gloves which may help some.  Review of Systems 14 point systems otherwise negative.  Negative for neck pain.   Objective: Vital Signs: There were no vitals taken for this visit.  Physical Exam Constitutional:      Appearance: She is well-developed.  HENT:     Head: Normocephalic.     Right Ear: External ear normal.     Left Ear: External ear normal. There is no impacted cerumen.  Eyes:     Pupils: Pupils are equal, round, and reactive  to light.  Neck:     Thyroid: No thyromegaly.     Trachea: No tracheal deviation.  Cardiovascular:     Rate and Rhythm: Normal rate.  Pulmonary:     Effort: Pulmonary effort is normal.  Abdominal:     Palpations: Abdomen is soft.  Musculoskeletal:     Cervical back: No rigidity.  Skin:    General: Skin is warm and dry.  Neurological:     Mental Status: She is alert and oriented to person, place, and time.  Psychiatric:        Behavior: Behavior normal.    Ortho Exam good range of motion cervical spine no brachial plexus tenderness negative Spurling no pain with compression.  Ulnar nerve at the elbow is normal.  Thenar muscles strong right and left.  Positive Phalen's test on the left at 15 seconds positive carpal compression test right and left.  Phalen test positive on the right at 30 seconds.  Sensation to small finger is normal.  Specialty Comments:  No specialty comments available.  Imaging: No results found.   PMFS History: Patient Active Problem List   Diagnosis Date Noted   Bilateral hand numbness 03/30/2021   IFG (impaired fasting glucose) 11/16/2020  Breast pain, left 10/03/2020   Essential hypertension, benign 09/27/2020   Migraine 09/27/2020   Chest pain 09/27/2020   Encounter for general adult medical examination with abnormal findings 03/18/2018   Abnormal Pap smear of cervix 07/27/2013   Past Medical History:  Diagnosis Date   Abnormal vaginal Pap smear    Hypertension    [employer]reesia 10/10/2020   Pregnancy induced hypertension    Vaginal Pap smear, abnormal     Family History  Problem Relation Age of Onset   Hypertension Mother    Diabetes Maternal Grandfather    Cancer Maternal Grandfather        lung   Stroke Maternal Grandfather    Other Paternal Grandfather        polyps   Hypertension Father    Autism Brother    Asthma Maternal Grandmother    Thyroid disease Maternal Grandmother     Past Surgical History:  Procedure Laterality Date    NO PAST SURGERIES     Social History   Occupational History   Occupation: unemployed  Tobacco Use   Smoking status: Never   Smokeless tobacco: Never  Vaping Use   Vaping Use: Never used  Substance and Sexual Activity   Alcohol use: Not Currently    Comment: rarely; not now   Drug use: No    Types: Marijuana    Comment: prior to pregnancy   Sexual activity: Yes    Birth control/protection: Condom

## 2021-04-04 DIAGNOSIS — R070 Pain in throat: Secondary | ICD-10-CM | POA: Diagnosis not present

## 2021-04-04 DIAGNOSIS — Z20822 Contact with and (suspected) exposure to covid-19: Secondary | ICD-10-CM | POA: Diagnosis not present

## 2021-04-04 DIAGNOSIS — J020 Streptococcal pharyngitis: Secondary | ICD-10-CM | POA: Diagnosis not present

## 2021-04-04 DIAGNOSIS — M791 Myalgia, unspecified site: Secondary | ICD-10-CM | POA: Diagnosis not present

## 2021-04-13 ENCOUNTER — Other Ambulatory Visit: Payer: Self-pay

## 2021-04-13 ENCOUNTER — Ambulatory Visit: Payer: 59 | Admitting: Orthopaedic Surgery

## 2021-05-18 ENCOUNTER — Ambulatory Visit: Payer: 59 | Admitting: Nurse Practitioner

## 2021-08-18 IMAGING — MG DIGITAL DIAGNOSTIC BILAT W/ TOMO W/ CAD
6 of 10 series · 6 of 30 positions shown · non-contrast
Comparison: No prior mammograms for comparison.

CLINICAL DATA: 30-year-old female states she had a recent chest CT
at [facility] which demonstrated an abnormality in the left
breast for which diagnostic mammography/ultrasound was required.
However review of patient's most recent chest CT images dated
09/30/2020 does not reveal any definite abnormalities and there is
nothing regarding the left breast mentioned in the report. Patient
does report tenderness involving the inner left breast. No palpable
areas of concern.

EXAM:
DIGITAL DIAGNOSTIC BILATERAL MAMMOGRAM WITH TOMOSYNTHESIS AND CAD;
ULTRASOUND LEFT BREAST LIMITED
TECHNIQUE: Bilateral digital diagnostic mammography and breast tomosynthesis
was performed. The images were evaluated with computer-aided
detection.; Targeted ultrasound examination of the left breast was
performed

[L TAN synth-2D]
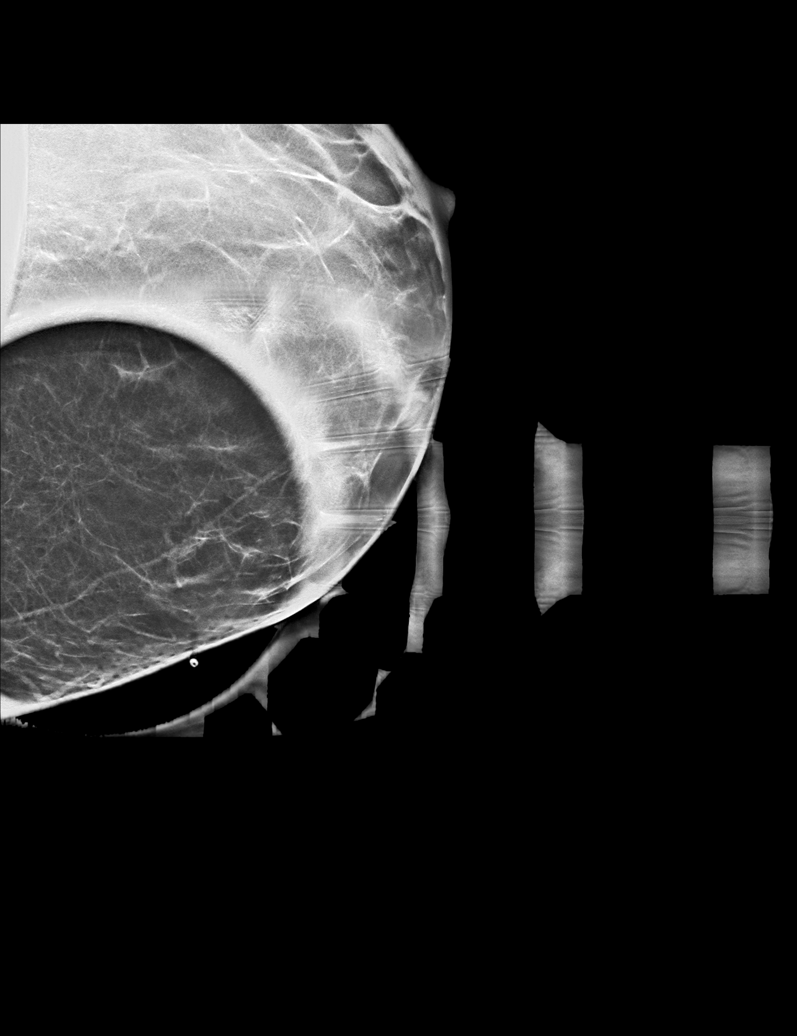

[L CC synth-2D]
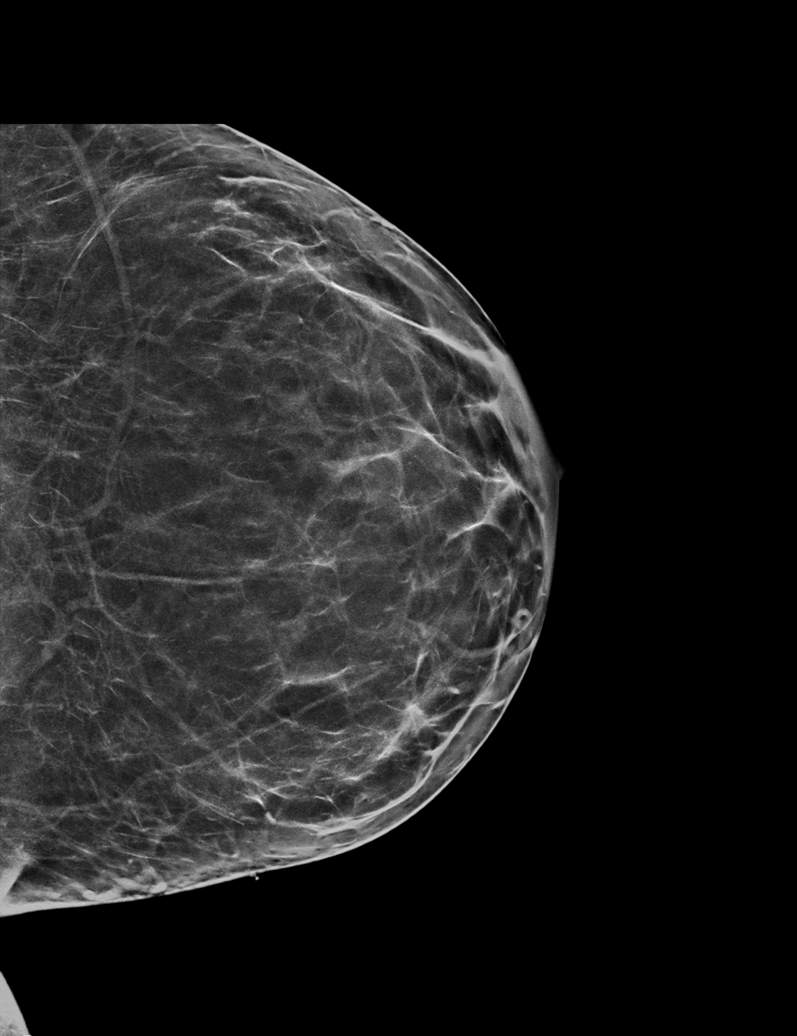

[R MLO synth-2D]
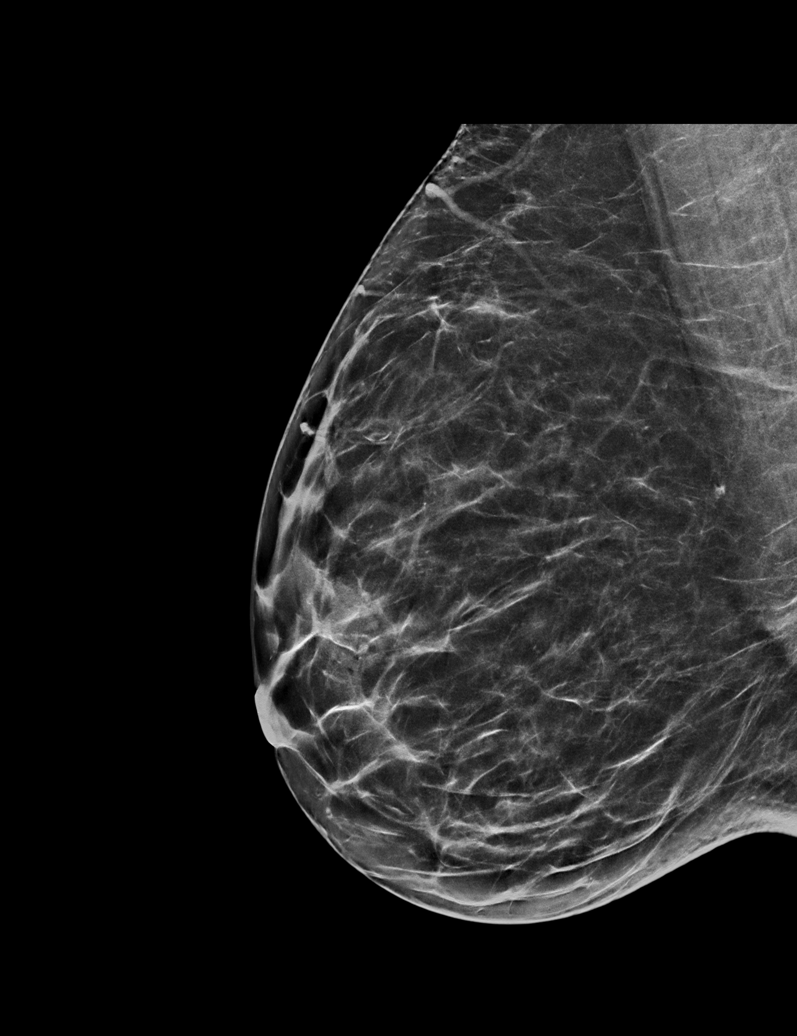

[L MLO synth-2D]
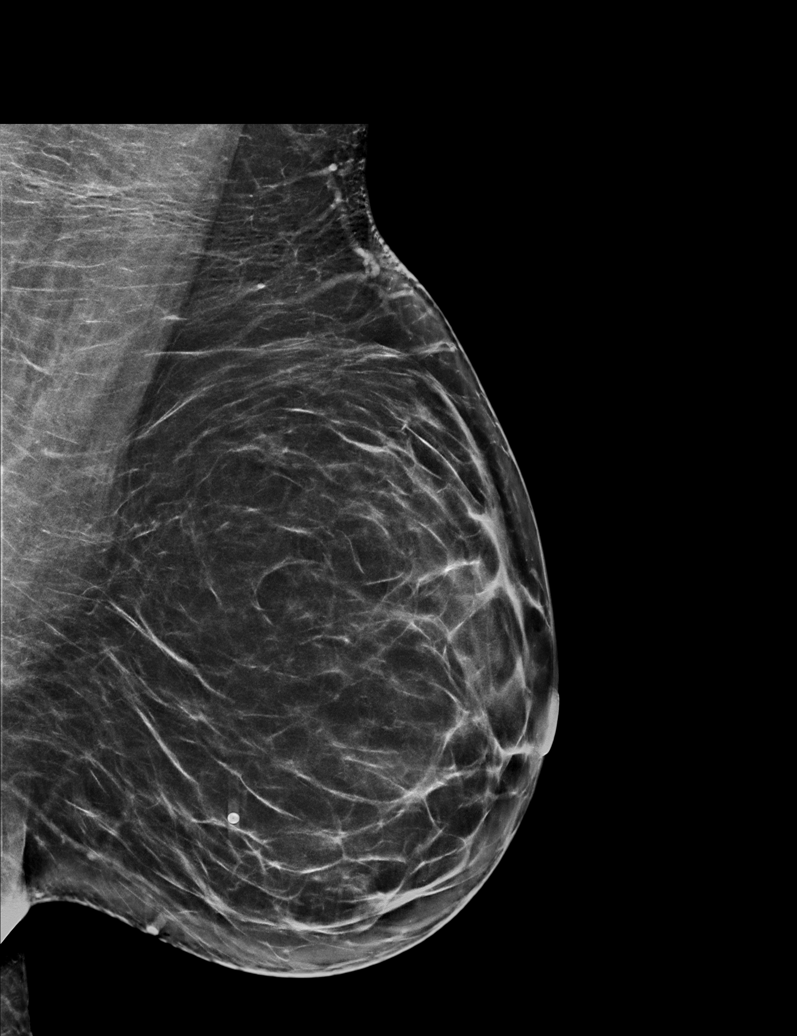

[R CC synth-2D]
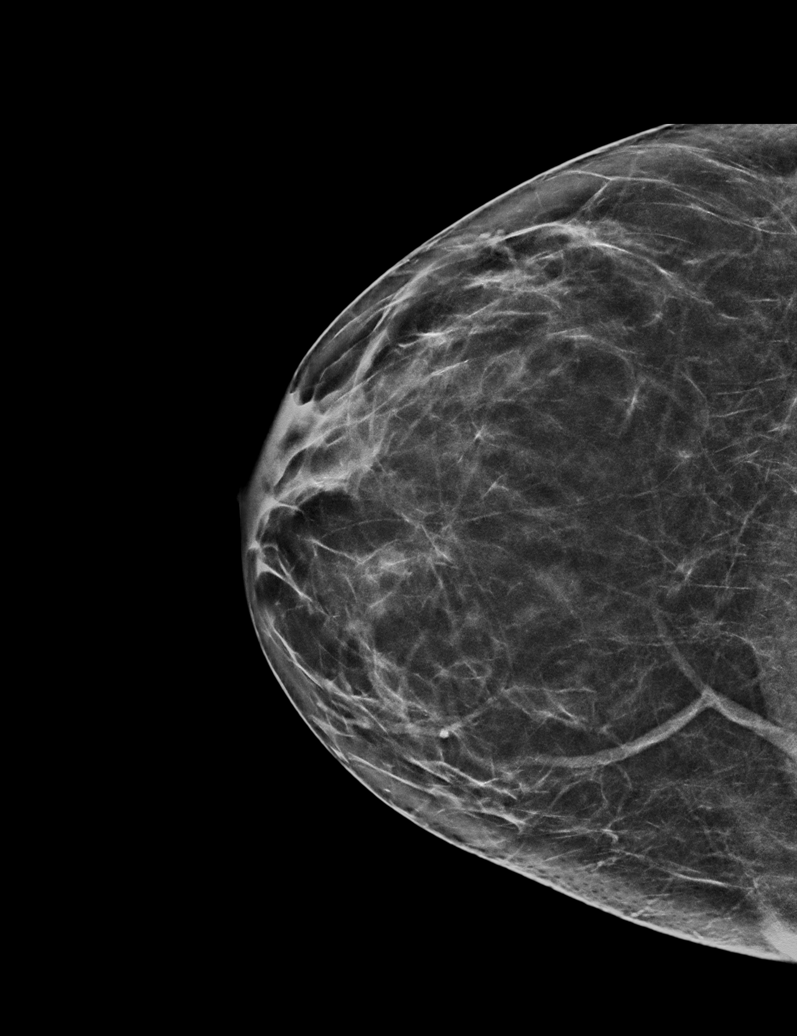

[R CC tomo · tomo slice 32/63.0]
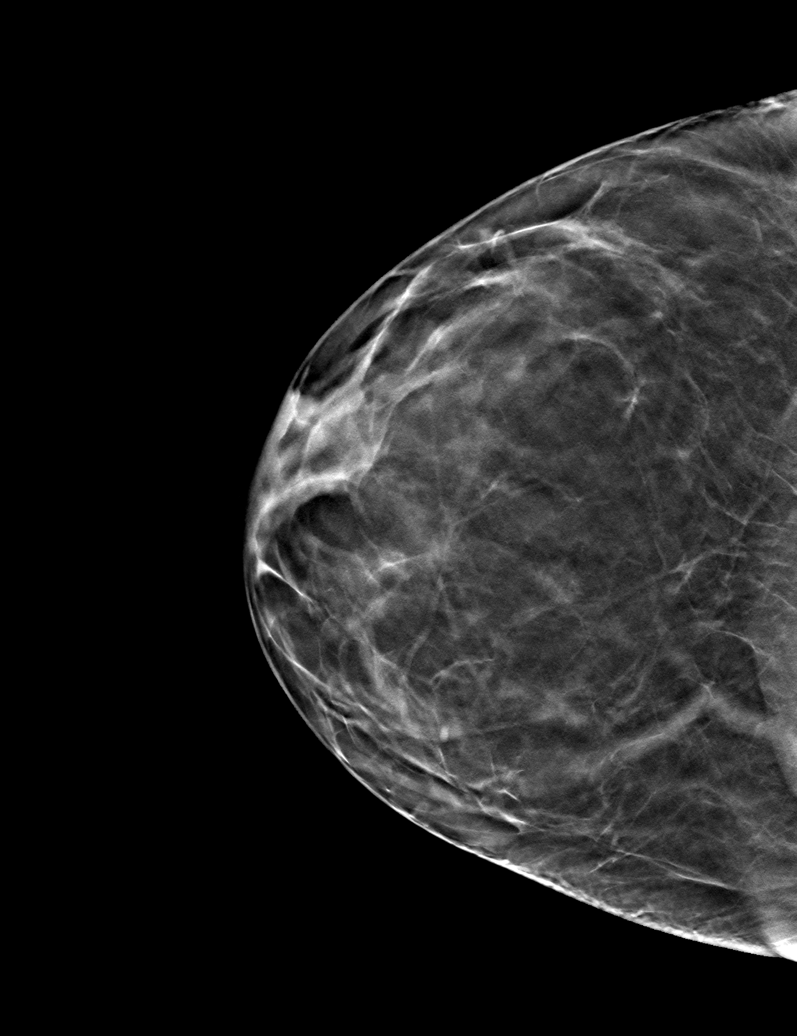

[6 of 30 positions shown; findings below may reference images not displayed]

Chest CT dated
09/30/2020.

ACR Breast Density Category b: There are scattered areas of
fibroglandular density.
FINDINGS: No suspicious masses or calcifications are seen in either breast.
Spot compression tangential tomograms were performed in the region
of tenderness in the inner left breast with no definite abnormality
seen.

Physical examination of the inner left breast does not reveal any
palpable masses. The patient states she has mostly tender with
deeper palpation and the pain radiates up toward her clavicle. No
underlying palpable masses.

Targeted ultrasound of the left breast was performed. No suspicious
masses or abnormality seen, only heterogeneous fibroglandular tissue
identified.
IMPRESSION: 1. No mammographic or sonographic abnormalities in the region of
pain in the inner left breast.

2.  No mammographic evidence of malignancy in either breast.

RECOMMENDATION:
1. Recommend further management of left breast pain be based on
clinical assessment.

2. Screening mammogram at age 40 unless there are persistent or
intervening clinical concerns. (Code:[id number]-[id number])

I have discussed the findings and recommendations with the patient.
If applicable, a reminder letter will be sent to the patient
regarding the next appointment.

BI-RADS CATEGORY  1: Negative.

## 2022-05-18 ENCOUNTER — Encounter: Payer: Self-pay | Admitting: Internal Medicine

## 2022-05-18 ENCOUNTER — Ambulatory Visit: Payer: Medicaid Other | Admitting: Internal Medicine

## 2022-05-18 ENCOUNTER — Other Ambulatory Visit: Payer: Self-pay | Admitting: Internal Medicine

## 2022-05-18 VITALS — BP 122/85 | HR 99 | Ht 64.0 in | Wt 154.4 lb

## 2022-05-18 DIAGNOSIS — I10 Essential (primary) hypertension: Secondary | ICD-10-CM

## 2022-05-18 DIAGNOSIS — F419 Anxiety disorder, unspecified: Secondary | ICD-10-CM | POA: Insufficient documentation

## 2022-05-18 DIAGNOSIS — Z2821 Immunization not carried out because of patient refusal: Secondary | ICD-10-CM

## 2022-05-18 MED ORDER — OLMESARTAN MEDOXOMIL 20 MG PO TABS: 10.0000 mg | ORAL_TABLET | Freq: Every day | ORAL | 1 refills | Status: DC

## 2022-05-18 MED ORDER — HYDROXYZINE HCL 10 MG PO TABS: 10.0000 mg | ORAL_TABLET | Freq: Three times a day (TID) | ORAL | 0 refills | Status: DC | PRN

## 2022-05-18 NOTE — Patient Instructions (Signed)
It was a pleasure to see you today.  Thank you for giving [place] the opportunity to be involved in your care.  Below is a brief recap of your visit and next steps.  We will plan to see you again in 6 weeks.  Summary Restart olmesartan 10 mg daily I have added hydroxyzine as needed for anxiety relief. We will follow up in 6 weeks for your annual exam

## 2022-05-18 NOTE — Progress Notes (Signed)
Acute Office Visit  Subjective:     Patient ID: Alicia Small, female    DOB: 03-Jan-1991, 31 y.o.   MRN: [id number]  Chief Complaint  Patient presents with   Medical Clearance    BP Clearance   Ms. Bennette presents for an acute visit today for blood pressure clearance.  She recently completed a physical examination in preparation for starting a job at [employer].  Her blood pressure was elevated at this evaluation.  She has a past medical history significant for hypertension and has most recently been prescribed olmesartan 10 mg daily.  She has not been taking her medication recently.  She was given a form to be completed by her PCP stating that her elevated blood pressure readings have been addressed.  Today Ms. Weinman is feeling well.  She endorses recent anxiety that she believes may be contributing to her elevated blood pressure readings.  She has not been taking olmesartan as previously prescribed and has been off of medication for over a year.  Review of Systems  Psychiatric/Behavioral:  The patient is nervous/anxious.   All other systems reviewed and are negative.     Objective:    BP 122/85   Pulse 99   Ht 5\' 4"  (1.626 m)   Wt 154 lb 6.4 oz (70 kg)   SpO2 98%   BMI 26.50 kg/m  BP Readings from Last 3 Encounters:  05/18/22 122/85  11/16/20 121/79  09/28/20 132/82   Physical Exam Vitals reviewed.  Constitutional:      General: She is not in acute distress.    Appearance: Normal appearance. She is not toxic-appearing.  HENT:     Head: Normocephalic and atraumatic.     Right Ear: External ear normal.     Left Ear: External ear normal.     Nose: Nose normal. No congestion or rhinorrhea.     Mouth/Throat:     Mouth: Mucous membranes are moist.     Pharynx: Oropharynx is clear. No oropharyngeal exudate or posterior oropharyngeal erythema.  Eyes:     General: No scleral icterus.    Extraocular Movements: Extraocular movements intact.      Conjunctiva/sclera: Conjunctivae normal.     Pupils: Pupils are equal, round, and reactive to light.  Cardiovascular:     Rate and Rhythm: Normal rate and regular rhythm.     Pulses: Normal pulses.     Heart sounds: Normal heart sounds. No murmur heard.    No friction rub. No gallop.  Pulmonary:     Effort: Pulmonary effort is normal.     Breath sounds: Normal breath sounds. No wheezing, rhonchi or rales.  Abdominal:     General: Abdomen is flat. Bowel sounds are normal. There is no distension.     Palpations: Abdomen is soft.     Tenderness: There is no abdominal tenderness.  Musculoskeletal:        General: No swelling. Normal range of motion.     Cervical back: Normal range of motion.     Right lower leg: No edema.     Left lower leg: No edema.  Lymphadenopathy:     Cervical: No cervical adenopathy.  Skin:    General: Skin is warm and dry.     Capillary Refill: Capillary refill takes less than 2 seconds.     Coloration: Skin is not jaundiced.  Neurological:     General: No focal deficit present.     Mental Status: She is alert and oriented  to person, place, and time.  Psychiatric:        Mood and Affect: Mood normal.        Behavior: Behavior normal.       Assessment & Plan:   Problem List Items Addressed This Visit       Essential hypertension, benign    Previously prescribed olmesartan milligrams daily for treatment of hypertension.  Her blood pressure in office today is 122/85.  She has had multiple elevated readings recently. -Through shared decision making, we will restart olmesartan 10 mg daily today.   -Plan for follow-up in 6 weeks for HTN check and her annual exam      Anxiety    She endorses recent anxiety that she believes may be contributing to her elevated blood pressure readings.  She is interested in an as needed medication for anxiety relief.   -I have prescribed hydroxyzine 10 mg as needed.  She will follow-up in 6 weeks for reassessment       Meds ordered this encounter  Medications   olmesartan (BENICAR) 20 MG tablet    Sig: Take 0.5 tablets (10 mg total) by mouth daily.    Dispense:  15 tablet    Refill:  1   hydrOXYzine (ATARAX) 10 MG tablet    Sig: Take 1 tablet (10 mg total) by mouth 3 (three) times daily as needed.    Dispense:  30 tablet    Refill:  0    Return in about 6 weeks (around 06/29/2022) for HTN, Anxiety, Annual Exam.  Billie Lade, MD

## 2022-05-18 NOTE — Assessment & Plan Note (Addendum)
Previously prescribed olmesartan milligrams daily for treatment of hypertension.  Her blood pressure in office today is 122/85.  She has had multiple elevated readings recently. -Through shared decision making, we will restart olmesartan 10 mg daily today.   -Plan for follow-up in 6 weeks for HTN check and her annual exam

## 2022-05-18 NOTE — Assessment & Plan Note (Signed)
She endorses recent anxiety that she believes may be contributing to her elevated blood pressure readings.  She is interested in an as needed medication for anxiety relief.   -I have prescribed hydroxyzine 10 mg as needed.  She will follow-up in 6 weeks for reassessment

## 2022-05-22 ENCOUNTER — Ambulatory Visit: Payer: 59 | Admitting: Family Medicine

## 2022-06-14 ENCOUNTER — Other Ambulatory Visit: Payer: Self-pay | Admitting: Internal Medicine

## 2022-06-14 DIAGNOSIS — F419 Anxiety disorder, unspecified: Secondary | ICD-10-CM

## 2022-06-29 ENCOUNTER — Encounter: Payer: Medicaid Other | Admitting: Internal Medicine

## 2022-07-06 ENCOUNTER — Encounter: Payer: Medicaid Other | Admitting: Internal Medicine

## 2022-07-26 DIAGNOSIS — Z20822 Contact with and (suspected) exposure to covid-19: Secondary | ICD-10-CM | POA: Diagnosis not present

## 2022-07-26 DIAGNOSIS — J039 Acute tonsillitis, unspecified: Secondary | ICD-10-CM | POA: Diagnosis not present

## 2022-07-26 DIAGNOSIS — J069 Acute upper respiratory infection, unspecified: Secondary | ICD-10-CM | POA: Diagnosis not present

## 2022-07-26 DIAGNOSIS — R070 Pain in throat: Secondary | ICD-10-CM | POA: Diagnosis not present

## 2022-08-09 ENCOUNTER — Encounter: Payer: 59 | Admitting: Internal Medicine

## 2022-08-09 ENCOUNTER — Encounter: Payer: Self-pay | Admitting: Internal Medicine

## 2024-04-28 ENCOUNTER — Ambulatory Visit: Payer: Self-pay

## 2024-04-28 NOTE — Telephone Encounter (Signed)
 FYI Only or Action Required?: FYI only for provider: ED advised. Refused ED- will go to UC   Patient was last seen in primary care on 05/18/2022 by Melvenia Manus BRAVO, MD.  Called Nurse Triage reporting Hypertension and Chest Pain.  Symptoms began several weeks ago.  Interventions attempted: Rest, hydration, or home remedies.  Symptoms are: gradually worsening.  Triage Disposition: Go to ED Now (Notify PCP)  Patient/caregiver understands and will follow disposition?: Unsure  Copied from CRM [id number]. Topic: Clinical - Red Word Triage >> Apr 28, 2024 12:01 PM Avram MATSU wrote: Red Word that prompted transfer to Nurse Triage: headaches, nose bleed, bp concerns   ----------------------------------------------------------------------- From previous Reason for Contact - Scheduling: Patient/patient representative is calling to schedule an appointment. Refer to attachments for appointment information. Reason for Disposition  [1] Chest pain (or angina) comes and goes AND [2] is happening more often (increasing in frequency) or getting worse (increasing in severity)  (Exception: Chest pains that last only a few seconds.)  Answer Assessment - Initial Assessment Questions Couple weeks patient has had more persistent Headaches with nose bleeds. There is a nurse on site at work, checked her BP and it was elevated. Has hx of HTN, was on olmasartan but did not like the side effects.  Slight light headedness, feels more like she needs to eat than dizzy. She reports chest discomfort directly under her left breast. She endorses this is normal for her when she has High BP, along with the headaches. She has delt with increasing frequency over the last few weeks.  Advised ED for evaluation. Patient does not feel she is bad enough to be seen in the ED. Advised to at least be seen in UC until patient can be evaluated in the office. Patient agrees to try UC and aware they might send her to ED.   1. BLOOD  PRESSURE: What is your blood pressure? Did you take at least two measurements 5 minutes apart?     142/100, 130/100 2. ONSET: When did you take your blood pressure?     Today  3. HOW: How did you take your blood pressure? (e.g., automatic home BP monitor, visiting nurse)     Manual arm cuff- nurse on work site  4. HISTORY: Do you have a history of high blood pressure?     Hx of HTN- was on olmasartan but stopped over a year ago  5. MEDICINES: Are you taking any medicines for blood pressure? Have you missed any doses recently?     Was on olmasartan but hasn't taken it for awhile  6. OTHER SYMPTOMS: Do you have any symptoms? (e.g., blurred vision, chest pain, difficulty breathing, headache, weakness)     R temporal headache, lightheaded, chest discomfort 7. PREGNANCY: Is there any chance you are pregnant? When was your last menstrual period?     denies  Answer Assessment - Initial Assessment Questions 1. LOCATION: Where does it hurt?       Left chest pain under her breast-  2. RADIATION: Does the pain go anywhere else? (e.g., into neck, jaw, arms, back)     denies 3. ONSET: When did the chest pain begin? (Minutes, hours or days)      A couple weeks every time she has a headache  4. PATTERN: Does the pain come and go, or has it been constant since it started?  Does it get worse with exertion?      Concentrated-- constant  5. DURATION: How long does it  last (e.g., seconds, minutes, hours)     As long as she has a headache 6. SEVERITY: How bad is the pain?  (e.g., Scale 1-10; mild, moderate, or severe)     Achy, concentrated  7. CARDIAC RISK FACTORS: Do you have any history of heart problems or risk factors for heart disease? (e.g., angina, prior heart attack; diabetes, high blood pressure, high cholesterol, smoker, or strong family history of heart disease)     HTN, migraines  8. PULMONARY RISK FACTORS: Do you have any history of lung disease?  (e.g.,  blood clots in lung, asthma, emphysema, birth control pills)     denies 9. CAUSE: What do you think is causing the chest pain?     Elevated BP 10. OTHER SYMPTOMS: Do you have any other symptoms? (e.g., dizziness, nausea, vomiting, sweating, fever, difficulty breathing, cough)       Headache, elevated BP,  11. PREGNANCY: Is there any chance you are pregnant? When was your last menstrual period?       denies  Protocols used: Blood Pressure - High-A-AH, Chest Pain-A-AH

## 2024-05-04 ENCOUNTER — Ambulatory Visit: Payer: Self-pay | Admitting: Physician Assistant

## 2024-05-06 ENCOUNTER — Ambulatory Visit: Payer: Self-pay | Admitting: Physician Assistant

## 2024-05-15 ENCOUNTER — Ambulatory Visit: Admitting: Physician Assistant

## 2024-05-19 ENCOUNTER — Ambulatory Visit

## 2024-05-19 ENCOUNTER — Ambulatory Visit: Admitting: Physician Assistant

## 2024-05-19 VITALS — BP 134/93 | HR 90 | Ht 64.0 in | Wt 167.0 lb

## 2024-05-19 DIAGNOSIS — I10 Essential (primary) hypertension: Secondary | ICD-10-CM | POA: Diagnosis not present

## 2024-05-19 DIAGNOSIS — R21 Rash and other nonspecific skin eruption: Secondary | ICD-10-CM

## 2024-05-19 DIAGNOSIS — R7301 Impaired fasting glucose: Secondary | ICD-10-CM

## 2024-05-19 MED ORDER — LOSARTAN POTASSIUM 25 MG PO TABS: 25.0000 mg | ORAL_TABLET | Freq: Every day | ORAL | 1 refills | Status: AC

## 2024-05-19 MED ORDER — KETOCONAZOLE 2 % EX CREA: 1.0000 | TOPICAL_CREAM | Freq: Every day | CUTANEOUS | 1 refills | Status: AC

## 2024-05-19 MED ORDER — METHYLPREDNISOLONE 4 MG PO TBPK: ORAL_TABLET | ORAL | 0 refills | Status: DC

## 2024-05-19 NOTE — Progress Notes (Unsigned)
° °  Established Patient Office Visit  Subjective   Patient ID: Alicia Small, female    DOB: 1990-11-30  Age: 33 y.o. MRN: [id number]  Chief Complaint  Patient presents with   Medical Management of Chronic Issues    Bp here for a blood pressure follow up, pt also has a rash on her pantie line and chest top of belly tried everything and not going away     HPI  Patient Active Problem List   Diagnosis Date Noted   Anxiety 05/18/2022   Bilateral hand numbness 03/30/2021   IFG (impaired fasting glucose) 11/16/2020   Breast pain, left 10/03/2020   Essential hypertension, benign 09/27/2020   Migraine 09/27/2020   Chest pain 09/27/2020   Encounter for general adult medical examination with abnormal findings 03/18/2018   Abnormal Pap smear of cervix 07/27/2013    ROS    Objective:     BP (!) 134/93   Pulse 90   Ht 5' 4 (1.626 m)   Wt 167 lb (75.8 kg)   SpO2 98%   BMI 28.67 kg/m  BP Readings from Last 3 Encounters:  05/19/24 (!) 134/93  05/18/22 122/85  11/16/20 121/79   Wt Readings from Last 3 Encounters:  05/19/24 167 lb (75.8 kg)  05/18/22 154 lb 6.4 oz (70 kg)  11/16/20 166 lb (75.3 kg)     Physical Exam Vitals and nursing note reviewed.  Constitutional:      Appearance: Normal appearance.  HENT:     Head: Normocephalic.  Eyes:     Extraocular Movements: Extraocular movements intact.     Pupils: Pupils are equal, round, and reactive to light.  Cardiovascular:     Rate and Rhythm: Normal rate and regular rhythm.  Pulmonary:     Effort: Pulmonary effort is normal.     Breath sounds: Normal breath sounds.  Musculoskeletal:     Cervical back: Normal range of motion and neck supple.  Neurological:     Mental Status: She is alert and oriented to person, place, and time.  Psychiatric:        Mood and Affect: Mood normal.        Thought Content: Thought content normal.     No results found for any visits on 05/19/24.  {Labs (Optional):23779}  The  ASCVD Risk score (Arnett DK, et al., 2019) failed to calculate for the following reasons:   The 2019 ASCVD risk score is only valid for ages 21 to 29   * - Cholesterol units were assumed    Assessment & Plan:   Problem List Items Addressed This Visit   None   No follow-ups on file.    Leita Longs, FNP

## 2024-05-20 ENCOUNTER — Ambulatory Visit: Payer: Self-pay

## 2024-05-20 DIAGNOSIS — R21 Rash and other nonspecific skin eruption: Secondary | ICD-10-CM | POA: Insufficient documentation

## 2024-05-20 LAB — BASIC METABOLIC PANEL WITH GFR
BUN/Creatinine Ratio: 14 (ref 9–23)
BUN: 13 mg/dL (ref 6–20)
CO2: 23 mmol/L (ref 20–29)
Calcium: 9.7 mg/dL (ref 8.7–10.2)
Chloride: 102 mmol/L (ref 96–106)
Creatinine, Ser: 0.96 mg/dL (ref 0.57–1.00)
Glucose: 108 mg/dL — ABNORMAL HIGH (ref 70–99)
Potassium: 4.4 mmol/L (ref 3.5–5.2)
Sodium: 140 mmol/L (ref 134–144)
eGFR: 80 mL/min/1.73 (ref >59)

## 2024-05-20 LAB — TSH+FREE T4
Free T4: 1.3 ng/dL (ref 0.82–1.77)
TSH: 1.03 u[IU]/mL (ref 0.450–4.500)

## 2024-05-20 LAB — HEMOGLOBIN A1C
Est. average glucose Bld gHb Est-mCnc: 105 mg/dL
Hgb A1c MFr Bld: 5.3 % (ref 4.8–5.6)

## 2024-05-20 NOTE — Assessment & Plan Note (Signed)
 Rash present for one week, possibly fungal due to location. Differential includes hives and fungal infection. - Prescribed antifungal cream. - Prescribed steroid pack with tapering dose over five days.

## 2024-05-20 NOTE — Assessment & Plan Note (Signed)
 Hypertension with recent elevated readings and symptoms. Previous medication discontinued due to side effects. Current blood pressure remains elevated. - Prescribed new antihypertensive medication without tablet splitting. - Ordered blood work for kidney function, blood sugar, and thyroid function. - Advised weekly blood pressure checks with work engineer, civil (consulting). - Scheduled follow-up in three months for reassessment.

## 2024-05-20 NOTE — Assessment & Plan Note (Signed)
-  will check A1c levels today.

## 2024-05-26 ENCOUNTER — Other Ambulatory Visit: Payer: Self-pay

## 2024-05-26 DIAGNOSIS — R21 Rash and other nonspecific skin eruption: Secondary | ICD-10-CM

## 2024-05-26 MED ORDER — METHYLPREDNISOLONE 4 MG PO TBPK: ORAL_TABLET | ORAL | 0 refills | Status: AC

## 2024-09-01 ENCOUNTER — Ambulatory Visit: Payer: Self-pay
